# Patient Record
Sex: Male | Born: 1985 | Race: Black or African American | Hispanic: No | Marital: Single | Smoking: Current every day smoker
Health system: Southern US, Community
[De-identification: ages and names within clinical notes are randomized; demographics above are authoritative.]

## PROBLEM LIST (undated history)

## (undated) DIAGNOSIS — R51 Headache: Secondary | ICD-10-CM

## (undated) DIAGNOSIS — A630 Anogenital (venereal) warts: Secondary | ICD-10-CM

## (undated) DIAGNOSIS — J45909 Unspecified asthma, uncomplicated: Secondary | ICD-10-CM

## (undated) HISTORY — PX: NO PAST SURGERIES: SHX2092

## (undated) HISTORY — DX: Anogenital (venereal) warts: A63.0

---
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
## 2005-04-29 ENCOUNTER — Emergency Department (HOSPITAL_COMMUNITY): Admission: EM | Admit: 2005-04-29 | Discharge: 2005-04-29 | Payer: Self-pay | Admitting: Emergency Medicine

## 2009-08-08 ENCOUNTER — Emergency Department (HOSPITAL_COMMUNITY): Admission: EM | Admit: 2009-08-08 | Discharge: 2009-08-08 | Payer: Self-pay | Admitting: Emergency Medicine

## 2010-01-24 ENCOUNTER — Emergency Department (HOSPITAL_COMMUNITY): Admission: EM | Admit: 2010-01-24 | Discharge: 2010-01-24 | Payer: Self-pay | Admitting: Emergency Medicine

## 2010-06-25 ENCOUNTER — Emergency Department (HOSPITAL_COMMUNITY): Admission: EM | Admit: 2010-06-25 | Discharge: 2010-06-25 | Payer: Self-pay | Admitting: Emergency Medicine

## 2010-08-03 ENCOUNTER — Emergency Department (HOSPITAL_COMMUNITY): Admission: EM | Admit: 2010-08-03 | Discharge: 2010-07-01 | Payer: Self-pay | Admitting: Emergency Medicine

## 2010-08-11 ENCOUNTER — Emergency Department (HOSPITAL_COMMUNITY)
Admission: EM | Admit: 2010-08-11 | Discharge: 2010-08-11 | Payer: Self-pay | Source: Home / Self Care | Admitting: Emergency Medicine

## 2012-01-31 ENCOUNTER — Emergency Department (HOSPITAL_COMMUNITY): Payer: Self-pay

## 2012-01-31 ENCOUNTER — Emergency Department (HOSPITAL_COMMUNITY)
Admission: EM | Admit: 2012-01-31 | Discharge: 2012-01-31 | Disposition: A | Discharge status: Home / Self Care | Payer: No Typology Code available for payment source | Attending: Emergency Medicine | Admitting: Emergency Medicine

## 2012-01-31 ENCOUNTER — Encounter (HOSPITAL_COMMUNITY): Payer: Self-pay | Admitting: Emergency Medicine

## 2012-01-31 DIAGNOSIS — M549 Dorsalgia, unspecified: Secondary | ICD-10-CM | POA: Insufficient documentation

## 2012-01-31 DIAGNOSIS — M542 Cervicalgia: Secondary | ICD-10-CM | POA: Insufficient documentation

## 2012-01-31 DIAGNOSIS — T1490XA Injury, unspecified, initial encounter: Secondary | ICD-10-CM | POA: Insufficient documentation

## 2012-01-31 DIAGNOSIS — R079 Chest pain, unspecified: Secondary | ICD-10-CM | POA: Insufficient documentation

## 2012-01-31 DIAGNOSIS — R51 Headache: Secondary | ICD-10-CM | POA: Insufficient documentation

## 2012-01-31 DIAGNOSIS — M25519 Pain in unspecified shoulder: Secondary | ICD-10-CM | POA: Insufficient documentation

## 2012-01-31 DIAGNOSIS — S39012A Strain of muscle, fascia and tendon of lower back, initial encounter: Secondary | ICD-10-CM

## 2012-01-31 DIAGNOSIS — Y92410 Unspecified street and highway as the place of occurrence of the external cause: Secondary | ICD-10-CM | POA: Insufficient documentation

## 2012-01-31 MED ORDER — CYCLOBENZAPRINE HCL 10 MG PO TABS: 10.0000 mg | ORAL_TABLET | Freq: Two times a day (BID) | ORAL | Status: AC | PRN

## 2012-01-31 MED ORDER — IBUPROFEN 800 MG PO TABS
800.0000 mg | ORAL_TABLET | Freq: Once | ORAL | Status: AC
  Administered 2012-01-31: 800 mg via ORAL
  Filled 2012-01-31: qty 1

## 2012-01-31 MED ORDER — IBUPROFEN 800 MG PO TABS: 800.0000 mg | ORAL_TABLET | Freq: Three times a day (TID) | ORAL | Status: DC

## 2012-01-31 MED ORDER — ALBUTEROL SULFATE HFA 108 (90 BASE) MCG/ACT IN AERS: 1.0000 | INHALATION_SPRAY | Freq: Four times a day (QID) | RESPIRATORY_TRACT | Status: DC | PRN
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
## 2012-01-31 NOTE — ED Notes (Signed)
Pt denies any questions upon discharge. 
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
## 2012-02-08 ENCOUNTER — Encounter (HOSPITAL_COMMUNITY): Payer: Self-pay

## 2012-02-08 ENCOUNTER — Emergency Department (HOSPITAL_COMMUNITY)
Admission: EM | Admit: 2012-02-08 | Discharge: 2012-02-08 | Disposition: A | Discharge status: Home Health | Payer: Self-pay | Attending: Emergency Medicine | Admitting: Emergency Medicine

## 2012-02-08 DIAGNOSIS — S335XXA Sprain of ligaments of lumbar spine, initial encounter: Secondary | ICD-10-CM | POA: Insufficient documentation

## 2012-02-08 DIAGNOSIS — J45901 Unspecified asthma with (acute) exacerbation: Secondary | ICD-10-CM | POA: Insufficient documentation

## 2012-02-08 DIAGNOSIS — T148XXA Other injury of unspecified body region, initial encounter: Secondary | ICD-10-CM

## 2012-02-08 DIAGNOSIS — X58XXXA Exposure to other specified factors, initial encounter: Secondary | ICD-10-CM | POA: Insufficient documentation

## 2012-02-08 HISTORY — DX: Unspecified asthma, uncomplicated: J45.909

## 2012-02-08 MED ORDER — KETOROLAC TROMETHAMINE 60 MG/2ML IM SOLN
60.0000 mg | Freq: Once | INTRAMUSCULAR | Status: AC
  Administered 2012-02-08: 60 mg via INTRAMUSCULAR
  Filled 2012-02-08: qty 2

## 2012-02-08 MED ORDER — METHYLPREDNISOLONE SODIUM SUCC 125 MG IJ SOLR
125.0000 mg | Freq: Once | INTRAMUSCULAR | Status: AC
  Administered 2012-02-08: 125 mg via INTRAVENOUS
  Filled 2012-02-08: qty 2

## 2012-02-08 MED ORDER — ALBUTEROL (5 MG/ML) CONTINUOUS INHALATION SOLN
10.0000 mg/h | INHALATION_SOLUTION | RESPIRATORY_TRACT | Status: AC
  Administered 2012-02-08: 10 mg/h via RESPIRATORY_TRACT

## 2012-02-08 MED ORDER — ACETAMINOPHEN 325 MG PO TABS
650.0000 mg | ORAL_TABLET | Freq: Once | ORAL | Status: AC
  Administered 2012-02-08: 650 mg via ORAL

## 2012-02-08 MED ORDER — ALBUTEROL SULFATE HFA 108 (90 BASE) MCG/ACT IN AERS
2.0000 | INHALATION_SPRAY | Freq: Four times a day (QID) | RESPIRATORY_TRACT | Status: DC
  Administered 2012-02-08: 2 via RESPIRATORY_TRACT
  Filled 2012-02-08: qty 6.7

## 2012-02-08 MED ORDER — ACETAMINOPHEN 325 MG PO TABS
ORAL_TABLET | ORAL | Status: AC
  Filled 2012-02-08: qty 2

## 2012-02-08 MED ORDER — ALBUTEROL SULFATE (5 MG/ML) 0.5% IN NEBU: 2.5000 mg | INHALATION_SOLUTION | Freq: Once | RESPIRATORY_TRACT | Status: DC

## 2012-02-08 MED ORDER — PREDNISONE 10 MG PO TABS: 50.0000 mg | ORAL_TABLET | Freq: Every day | ORAL | Status: DC
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
## 2012-02-09 NOTE — ED Provider Notes (Signed)
Medical screening examination/treatment/procedure(s) were performed by non-physician practitioner and as supervising physician I was immediately available for consultation/collaboration.   Dayton Bailiff, MD 02/09/12 1622

## 2012-02-25 DIAGNOSIS — A630 Anogenital (venereal) warts: Secondary | ICD-10-CM

## 2012-02-25 HISTORY — DX: Anogenital (venereal) warts: A63.0

## 2012-03-03 ENCOUNTER — Encounter (INDEPENDENT_AMBULATORY_CARE_PROVIDER_SITE_OTHER): Payer: Self-pay | Admitting: Surgery

## 2012-03-03 ENCOUNTER — Ambulatory Visit (INDEPENDENT_AMBULATORY_CARE_PROVIDER_SITE_OTHER): Payer: Self-pay | Admitting: Surgery

## 2012-03-03 ENCOUNTER — Telehealth (INDEPENDENT_AMBULATORY_CARE_PROVIDER_SITE_OTHER): Payer: Self-pay

## 2012-03-03 VITALS — BP 122/80 | HR 68 | Temp 96.9°F | Resp 12 | Ht 73.0 in | Wt 168.8 lb

## 2012-03-03 DIAGNOSIS — A630 Anogenital (venereal) warts: Secondary | ICD-10-CM | POA: Insufficient documentation

## 2012-03-03 NOTE — Progress Notes (Signed)
General Surgery [facility]  Chief Complaint  Patient presents with  . Rectal Pain    new pt- eval anal warts    HISTORY: The patient is a 26 year old black male referred by Dr. Burnice Logan at [facility].  Patient has a 6 month history of anal condylomata. He has received no prior treatment.  Past Medical History  Diagnosis Date  . Asthma   . Anal warts      Current Outpatient Prescriptions  Medication Sig Dispense Refill  . albuterol (PROVENTIL HFA;VENTOLIN HFA) 108 (90 BASE) MCG/ACT inhaler Inhale 1-2 puffs into the lungs every 6 (six) hours as needed for wheezing.  1 Inhaler  0  . predniSONE (DELTASONE) 10 MG tablet Take 5 tablets (50 mg total) by mouth daily.  15 tablet  0     No Known Allergies   History reviewed. No pertinent family history.   History   Social History  . Marital Status: Single    Spouse Name: N/A    Number of Children: N/A  . Years of Education: N/A   Social History Main Topics  . Smoking status: Current Everyday Smoker    Types: Cigars  . Smokeless tobacco: None   Comment: smokes black and milds  . Alcohol Use: Yes     occasionally  . Drug Use: No  . Sexually Active:    Other Topics Concern  . None   Social History Narrative  . None     REVIEW OF SYSTEMS - PERTINENT POSITIVES ONLY: Denies bleeding. Mild pain. No prior lesions. No prior surgery.  EXAM: Filed Vitals:   03/03/12 0941  BP: 122/80  Pulse: 68  Temp: 96.9 F (36.1 C)  Resp: 12    HEENT: normocephalic; pupils equal and reactive; sclerae clear; dentition good; mucous membranes moist NECK:  symmetric on extension; no palpable anterior or posterior cervical lymphadenopathy; no supraclavicular masses; no tenderness CHEST: clear to auscultation bilaterally without rales, rhonchi, or wheezes CARDIAC: regular rate and rhythm without significant murmur; peripheral pulses are full GU:  External exam shows relatively  normal anoderm. Eversion however shows anal condylomata within the anal canal. Digital rectal exam was performed with mild discomfort. Anoscopy is performed. 360 view of the lower rectal mucosa shows normal mucosa. Grade 1-2 internal hemorrhoids without friability or bleeding. There are approximately 6 lesions located mostly in the anterior anal canal extending from the dentate line to the anal verge. These all measure between 2 and 6 mm in size. EXT:  non-tender without edema; no deformity NEURO: no gross focal deficits; no sign of tremor   LABORATORY RESULTS: See [facility] (CHL-Epic) for most recent results   RADIOLOGY RESULTS: See [facility] (CHL-Epic) for most recent results   IMPRESSION: Anal condylomata  PLAN: I discussed with the patient the indications for surgical fulguration of his anal condylomata. This will be performed as an outpatient procedure. General anesthesia will be required. Patient understands and wishes to proceed.  The risks and benefits of the procedure have been discussed at length with the patient.  The patient understands the proposed procedure, potential alternative treatments, and the course of recovery to be expected.  All of the patient's questions have been answered at this time.  The patient wishes to proceed with surgery.  Velora Heckler, MD, FACS General & Endocrine Surgery [facility]   Visit Diagnoses: 1. Perianal condylomata     Primary Care Physician: No primary provider on file.

## 2012-03-03 NOTE — Patient Instructions (Signed)
ANORECTAL PROCEDURES: 1.  Tub soaks 2-3 times daily in warm water (may add Epsom salts if desired) 2.  Stool softener for one month (store brand Miralax or Colace) 3.  Avoid toilet paper - use baby wipes or Tucks pads 4.  Increase water intake - 6-8 glasses daily 5.  Apply dry pad to area until drainage stops 

## 2012-03-03 NOTE — Telephone Encounter (Signed)
LMOM for po appt date.

## 2012-03-04 ENCOUNTER — Encounter (HOSPITAL_BASED_OUTPATIENT_CLINIC_OR_DEPARTMENT_OTHER): Payer: Self-pay | Admitting: *Deleted

## 2012-03-06 ENCOUNTER — Encounter (HOSPITAL_BASED_OUTPATIENT_CLINIC_OR_DEPARTMENT_OTHER)
Admission: RE | Disposition: A | Discharge status: Home / Self Care | Payer: Self-pay | Source: Ambulatory Visit | Attending: Surgery

## 2012-03-06 ENCOUNTER — Encounter (HOSPITAL_BASED_OUTPATIENT_CLINIC_OR_DEPARTMENT_OTHER): Payer: Self-pay | Admitting: Anesthesiology

## 2012-03-06 ENCOUNTER — Ambulatory Visit (HOSPITAL_BASED_OUTPATIENT_CLINIC_OR_DEPARTMENT_OTHER)
Admission: RE | Admit: 2012-03-06 | Discharge: 2012-03-06 | Disposition: A | Discharge status: Home / Self Care | Payer: Self-pay | Source: Ambulatory Visit | Attending: Surgery | Admitting: Surgery

## 2012-03-06 ENCOUNTER — Encounter (HOSPITAL_BASED_OUTPATIENT_CLINIC_OR_DEPARTMENT_OTHER): Payer: Self-pay | Admitting: *Deleted

## 2012-03-06 HISTORY — DX: Headache: R51

## 2012-03-06 SURGERY — FULGURATION, WART, ANUS
Anesthesia: General

## 2012-03-06 MED ORDER — FLEET ENEMA 7-19 GM/118ML RE ENEM: 1.0000 | ENEMA | Freq: Once | RECTAL | Status: DC

## 2012-03-06 MED ORDER — LACTATED RINGERS IV SOLN: INTRAVENOUS | Status: DC

## 2012-03-06 MED ORDER — SODIUM CHLORIDE 0.9 % IV SOLN: 1.0000 g | INTRAVENOUS | Status: DC

## 2012-03-06 SURGICAL SUPPLY — 27 items
BLADE SURG 15 STRL LF DISP TIS (BLADE) ×1 IMPLANT
BLADE SURG 15 STRL SS (BLADE)
CLOTH BEACON ORANGE TIMEOUT ST (SAFETY) ×1 IMPLANT
DECANTER SPIKE VIAL GLASS SM (MISCELLANEOUS) ×1 IMPLANT
DRAPE UTILITY XL STRL (DRAPES) ×1 IMPLANT
ELECT REM PT RETURN 9FT ADLT (ELECTROSURGICAL)
ELECTRODE REM PT RTRN 9FT ADLT (ELECTROSURGICAL) ×1 IMPLANT
GAUZE SPONGE 4X4 12PLY STRL LF (GAUZE/BANDAGES/DRESSINGS) ×1 IMPLANT
GLOVE SURG ORTHO 8.0 STRL STRW (GLOVE) ×1 IMPLANT
GOWN PREVENTION PLUS XLARGE (GOWN DISPOSABLE) ×1 IMPLANT
GOWN PREVENTION PLUS XXLARGE (GOWN DISPOSABLE) ×1 IMPLANT
NDL HYPO 25X1 1.5 SAFETY (NEEDLE) ×1 IMPLANT
NEEDLE HYPO 25X1 1.5 SAFETY (NEEDLE) IMPLANT
PACK BASIN DAY SURGERY FS (CUSTOM PROCEDURE TRAY) ×1 IMPLANT
PACK LITHOTOMY IV (CUSTOM PROCEDURE TRAY) IMPLANT
PENCIL BUTTON HOLSTER BLD 10FT (ELECTRODE) ×1 IMPLANT
SPONGE SURGIFOAM ABS GEL 12-7 (HEMOSTASIS) IMPLANT
SURGILUBE 2OZ TUBE FLIPTOP (MISCELLANEOUS) ×4 IMPLANT
SUT CHROMIC 3 0 SH 27 (SUTURE) ×1 IMPLANT
SYR CONTROL 10ML LL (SYRINGE) ×1 IMPLANT
TOWEL OR 17X24 6PK STRL BLUE (TOWEL DISPOSABLE) ×2 IMPLANT
TOWEL OR NON WOVEN STRL DISP B (DISPOSABLE) ×1 IMPLANT
TRAY DSU PREP LF (CUSTOM PROCEDURE TRAY) ×1 IMPLANT
TRAY PROCTOSCOPIC FIBER OPTIC (SET/KITS/TRAYS/PACK) IMPLANT
TUBING STERILE (MISCELLANEOUS) ×1 IMPLANT
UNDERPAD 30X30 INCONTINENT (UNDERPADS AND DIAPERS) ×1 IMPLANT
WATER STERILE IRR 1000ML POUR (IV SOLUTION) ×1 IMPLANT

## 2012-03-06 NOTE — Progress Notes (Signed)
Patient's procedure cancelled per Dr. Gelene Mink as he admitted to eating chips at 11a.  Patient notified of cancellation by Dr. Gelene Mink.

## 2012-03-06 NOTE — Progress Notes (Signed)
Dr. Gelene Mink notified that patient admitted to eating a small amount of chips at 11a 03/06/12

## 2012-03-07 ENCOUNTER — Encounter (HOSPITAL_BASED_OUTPATIENT_CLINIC_OR_DEPARTMENT_OTHER): Payer: Self-pay | Admitting: Surgery

## 2012-04-08 ENCOUNTER — Encounter (INDEPENDENT_AMBULATORY_CARE_PROVIDER_SITE_OTHER): Payer: Self-pay | Admitting: Surgery

## 2012-10-27 ENCOUNTER — Ambulatory Visit (INDEPENDENT_AMBULATORY_CARE_PROVIDER_SITE_OTHER): Payer: Self-pay | Admitting: Surgery

## 2012-10-27 ENCOUNTER — Encounter (INDEPENDENT_AMBULATORY_CARE_PROVIDER_SITE_OTHER): Payer: Self-pay | Admitting: Surgery

## 2012-10-27 VITALS — BP 120/80 | HR 67 | Temp 96.4°F | Resp 18 | Ht 75.0 in | Wt 185.2 lb

## 2012-10-27 DIAGNOSIS — A630 Anogenital (venereal) warts: Secondary | ICD-10-CM

## 2012-10-27 NOTE — Progress Notes (Signed)
General Surgery - [facility]  Visit Diagnoses: 1. Perianal condylomata     HISTORY: Patient is a 27-year-old black male with anal condylomata. Patient was seen last summer and scheduled for fulguration. Unfortunately he on the morning of surgery and anesthesia appropriately canceled his case. Patient now returns for evaluation and to schedule his surgical procedure.  PERTINENT REVIEW OF SYSTEMS: Patient notes slow enlargement of lesions.  He experiences minor pain. He denies bleeding. He has noted a similar lesion on the penis.  EXAM: HEENT: normocephalic; pupils equal and reactive; sclerae clear; dentition good; mucous membranes moist NECK:  symmetric on extension; no palpable anterior or posterior cervical lymphadenopathy; no supraclavicular masses; no tenderness CHEST: clear to auscultation bilaterally without rales, rhonchi, or wheezes CARDIAC: regular rate and rhythm without significant murmur; peripheral pulses are full GU:  Flat whitish plaque on the anterior glans of penis; external anal exam essentially normal; he version of the anoderm however shows at least 5 lesions consistent with condylomata within the anal canal; the largest lesion measures approximately 6 mm in diameter EXT:  non-tender without edema; no deformity NEURO: no gross focal deficits; no sign of tremor   IMPRESSION: #1 anal condylomata, multiple #2 possible lesion on glans of penis  PLAN: Patient and I discussed surgical management. I have recommended excision and surgical fulguration. I will not treat the lesion on the penis. That would require evaluation by urology.  We will schedule the patient to undergo examination under anesthesia and excision and fulguration of anal condylomata. This will be scheduled as an outpatient surgical procedure under general anesthesia.  The risks and benefits of the procedure have been discussed at length with the patient.  The patient understands the  proposed procedure, potential alternative treatments, and the course of recovery to be expected.  All of the patient's questions have been answered at this time.  The patient wishes to proceed with surgery.  Todd M. Gerkin, MD, FACS [facility] Office: [phone]    

## 2012-10-27 NOTE — Patient Instructions (Signed)
Genital Warts Genital warts are a sexually transmitted infection. They may appear as small bumps on the tissues of the genital area. CAUSES  Genital warts are caused by a virus called human papillomavirus (HPV). HPV is the most common sexually transmitted disease (STD) and infection of the sex organs. This infection is spread by having unprotected sex with an infected person. It can be spread by vaginal, anal, and oral sex. Many people do not know they are infected. They may be infected for years without problems. However, even if they do not have problems, they can unknowingly pass the infection to their sexual partners. SYMPTOMS   Itching and irritation in the genital area.  Warts that bleed.  Painful sexual intercourse. DIAGNOSIS  Warts are usually recognized with the naked eye on the vagina, vulva, perineum, anus, and rectum. Certain tests can also diagnose genital warts, such as:  A Pap test.  A tissue sample (biopsy) exam.  Colposcopy. A magnifying tool is used to examine the vagina and cervix. The HPV cells will change color when certain solutions are used. TREATMENT  Warts can be removed by:  Applying certain chemicals, such as cantharidin or podophyllin.  Liquid nitrogen freezing (cryotherapy).  Immunotherapy with candida or trichophyton injections.  Laser treatment.  Burning with an electrified probe (electrocautery).  Interferon injections.  Surgery. PREVENTION  HPV vaccination can help prevent HPV infections that cause genital warts and that cause cancer of the cervix. It is recommended that the vaccination be given to people between the ages 9 to 26 years old. The vaccine might not work as well or might not work at all if you already have HPV. It should not be given to pregnant women. HOME CARE INSTRUCTIONS   It is important to follow your caregiver's instructions. The warts will not go away without treatment. Repeat treatments are often needed to get rid of warts.  Even after it appears that the warts are gone, the normal tissue underneath often remains infected.  Do not try to treat genital warts with medicine used to treat hand warts. This type of medicine is strong and can burn the skin in the genital area, causing more damage.  Tell your past and current sexual partner(s) that you have genital warts. They may be infected also and need treatment.  Avoid sexual contact while being treated.  Do not touch or scratch the warts. The infection may spread to other parts of your body.  Women with genital warts should have a cervical cancer check (Pap test) at least once a year. This type of cancer is slow-growing and can be cured if found early. Chances of developing cervical cancer are increased with HPV.  Inform your obstetrician about your warts in the event of pregnancy. This virus can be passed to the baby's respiratory tract. Discuss this with your caregiver.  Use a condom during sexual intercourse. Following treatment, the use of condoms will help prevent reinfection.  Ask your caregiver about using over-the-counter anti-itch creams. SEEK MEDICAL CARE IF:   Your treated skin becomes red, swollen, or painful.  You have a fever.  You feel generally ill.  You feel little lumps in and around your genital area.  You are bleeding or have painful sexual intercourse. MAKE SURE YOU:   Understand these instructions.  Will watch your condition.  Will get help right away if you are not doing well or get worse. Document Released: 08/10/2000 Document Revised: 11/05/2011 Document Reviewed: 02/19/2011 ExitCare Patient Information 2013 [employer].  

## 2012-10-29 ENCOUNTER — Encounter (HOSPITAL_BASED_OUTPATIENT_CLINIC_OR_DEPARTMENT_OTHER): Payer: Self-pay | Admitting: *Deleted

## 2012-10-30 ENCOUNTER — Encounter (HOSPITAL_BASED_OUTPATIENT_CLINIC_OR_DEPARTMENT_OTHER): Payer: Self-pay | Admitting: *Deleted

## 2012-10-30 ENCOUNTER — Encounter (HOSPITAL_BASED_OUTPATIENT_CLINIC_OR_DEPARTMENT_OTHER): Payer: Self-pay | Admitting: Certified Registered Nurse Anesthetist

## 2012-10-30 ENCOUNTER — Ambulatory Visit (HOSPITAL_BASED_OUTPATIENT_CLINIC_OR_DEPARTMENT_OTHER): Payer: Self-pay | Admitting: Certified Registered Nurse Anesthetist

## 2012-10-30 ENCOUNTER — Encounter (HOSPITAL_BASED_OUTPATIENT_CLINIC_OR_DEPARTMENT_OTHER)
Admission: RE | Disposition: A | Discharge status: Home / Self Care | Payer: Self-pay | Source: Ambulatory Visit | Attending: Surgery

## 2012-10-30 ENCOUNTER — Ambulatory Visit (HOSPITAL_BASED_OUTPATIENT_CLINIC_OR_DEPARTMENT_OTHER)
Admission: RE | Admit: 2012-10-30 | Discharge: 2012-10-30 | Disposition: A | Discharge status: Home / Self Care | Payer: Self-pay | Source: Ambulatory Visit | Attending: Surgery | Admitting: Surgery

## 2012-10-30 DIAGNOSIS — A630 Anogenital (venereal) warts: Secondary | ICD-10-CM | POA: Insufficient documentation

## 2012-10-30 DIAGNOSIS — N4889 Other specified disorders of penis: Secondary | ICD-10-CM | POA: Insufficient documentation

## 2012-10-30 DIAGNOSIS — J45909 Unspecified asthma, uncomplicated: Secondary | ICD-10-CM | POA: Insufficient documentation

## 2012-10-30 HISTORY — PX: WART FULGURATION: SHX5245

## 2012-10-30 SURGERY — FULGURATION, WART, ANUS
Anesthesia: General | Site: Rectum | Wound class: Clean Contaminated

## 2012-10-30 MED ORDER — FENTANYL CITRATE 0.05 MG/ML IJ SOLN: 50.0000 ug | INTRAMUSCULAR | Status: DC | PRN

## 2012-10-30 MED ORDER — ONDANSETRON HCL 4 MG/2ML IJ SOLN
INTRAMUSCULAR | Status: DC | PRN
  Administered 2012-10-30: 4 mg via INTRAVENOUS

## 2012-10-30 MED ORDER — CEFAZOLIN SODIUM-DEXTROSE 2-3 GM-% IV SOLR
2.0000 g | INTRAVENOUS | Status: AC
  Administered 2012-10-30: 2 g via INTRAVENOUS

## 2012-10-30 MED ORDER — PROMETHAZINE HCL 25 MG/ML IJ SOLN: 6.2500 mg | INTRAMUSCULAR | Status: DC | PRN

## 2012-10-30 MED ORDER — LACTATED RINGERS IV SOLN
INTRAVENOUS | Status: DC
  Administered 2012-10-30: 11:00:00 via INTRAVENOUS

## 2012-10-30 MED ORDER — FENTANYL CITRATE 0.05 MG/ML IJ SOLN
INTRAMUSCULAR | Status: DC | PRN
  Administered 2012-10-30 (×2): 50 ug via INTRAVENOUS

## 2012-10-30 MED ORDER — LIDOCAINE HCL (CARDIAC) 20 MG/ML IV SOLN
INTRAVENOUS | Status: DC | PRN
  Administered 2012-10-30: 60 mg via INTRAVENOUS

## 2012-10-30 MED ORDER — PROPOFOL 10 MG/ML IV BOLUS
INTRAVENOUS | Status: DC | PRN
  Administered 2012-10-30: 200 mg via INTRAVENOUS

## 2012-10-30 MED ORDER — OXYCODONE HCL 5 MG PO TABS: 5.0000 mg | ORAL_TABLET | Freq: Once | ORAL | Status: DC | PRN

## 2012-10-30 MED ORDER — OXYCODONE HCL 5 MG/5ML PO SOLN: 5.0000 mg | Freq: Once | ORAL | Status: DC | PRN

## 2012-10-30 MED ORDER — HYDROCODONE-ACETAMINOPHEN 5-325 MG PO TABS: 1.0000 | ORAL_TABLET | ORAL | Status: DC | PRN

## 2012-10-30 MED ORDER — MIDAZOLAM HCL 5 MG/5ML IJ SOLN
INTRAMUSCULAR | Status: DC | PRN
  Administered 2012-10-30: 1 mg via INTRAVENOUS

## 2012-10-30 MED ORDER — MIDAZOLAM HCL 2 MG/2ML IJ SOLN: 1.0000 mg | INTRAMUSCULAR | Status: DC | PRN

## 2012-10-30 MED ORDER — MEPERIDINE HCL 25 MG/ML IJ SOLN: 6.2500 mg | INTRAMUSCULAR | Status: DC | PRN

## 2012-10-30 MED ORDER — BUPIVACAINE HCL (PF) 0.25 % IJ SOLN
INTRAMUSCULAR | Status: DC | PRN
  Administered 2012-10-30: 10 mL

## 2012-10-30 MED ORDER — LIDOCAINE HCL (CARDIAC) 20 MG/ML IV SOLN: INTRAVENOUS | Status: DC | PRN

## 2012-10-30 MED ORDER — HYDROMORPHONE HCL PF 1 MG/ML IJ SOLN: 0.2500 mg | INTRAMUSCULAR | Status: DC | PRN

## 2012-10-30 MED ORDER — DEXAMETHASONE SODIUM PHOSPHATE 4 MG/ML IJ SOLN
INTRAMUSCULAR | Status: DC | PRN
  Administered 2012-10-30: 10 mg via INTRAVENOUS

## 2012-10-30 MED ORDER — BACITRACIN ZINC 500 UNIT/GM EX OINT
TOPICAL_OINTMENT | CUTANEOUS | Status: DC | PRN
  Administered 2012-10-30: 1 via TOPICAL

## 2012-10-30 SURGICAL SUPPLY — 34 items
BLADE SURG 15 STRL LF DISP TIS (BLADE) ×1 IMPLANT
BLADE SURG 15 STRL SS (BLADE) ×2
BRIEF STRETCH FOR OB PAD LRG (UNDERPADS AND DIAPERS) ×1 IMPLANT
CLOTH BEACON ORANGE TIMEOUT ST (SAFETY) ×2 IMPLANT
DECANTER SPIKE VIAL GLASS SM (MISCELLANEOUS) ×2 IMPLANT
DRAPE UTILITY XL STRL (DRAPES) ×2 IMPLANT
DRSG PAD ABDOMINAL 8X10 ST (GAUZE/BANDAGES/DRESSINGS) ×1 IMPLANT
ELECT REM PT RETURN 9FT ADLT (ELECTROSURGICAL) ×2
ELECTRODE REM PT RTRN 9FT ADLT (ELECTROSURGICAL) ×1 IMPLANT
FILTER 7/8 IN (FILTER) ×1 IMPLANT
GAUZE SPONGE 4X4 12PLY STRL LF (GAUZE/BANDAGES/DRESSINGS) ×2 IMPLANT
GLOVE BIO SURGEON STRL SZ7 (GLOVE) ×1 IMPLANT
GLOVE BIOGEL M STRL SZ7.5 (GLOVE) ×1 IMPLANT
GLOVE BIOGEL PI IND STRL 8 (GLOVE) IMPLANT
GLOVE BIOGEL PI INDICATOR 8 (GLOVE) ×1
GLOVE SURG ORTHO 8.0 STRL STRW (GLOVE) ×2 IMPLANT
GOWN PREVENTION PLUS XLARGE (GOWN DISPOSABLE) ×1 IMPLANT
GOWN PREVENTION PLUS XXLARGE (GOWN DISPOSABLE) ×4 IMPLANT
NDL HYPO 25X1 1.5 SAFETY (NEEDLE) ×1 IMPLANT
NEEDLE HYPO 25X1 1.5 SAFETY (NEEDLE) ×2 IMPLANT
PACK BASIN DAY SURGERY FS (CUSTOM PROCEDURE TRAY) ×2 IMPLANT
PACK LITHOTOMY IV (CUSTOM PROCEDURE TRAY) ×1 IMPLANT
PENCIL BUTTON HOLSTER BLD 10FT (ELECTRODE) ×2 IMPLANT
SPONGE SURGIFOAM ABS GEL 12-7 (HEMOSTASIS) IMPLANT
SURGILUBE 2OZ TUBE FLIPTOP (MISCELLANEOUS) ×5 IMPLANT
SUT CHROMIC 3 0 SH 27 (SUTURE) ×2 IMPLANT
SYR CONTROL 10ML LL (SYRINGE) ×2 IMPLANT
TOWEL OR 17X24 6PK STRL BLUE (TOWEL DISPOSABLE) ×4 IMPLANT
TOWEL OR NON WOVEN STRL DISP B (DISPOSABLE) ×2 IMPLANT
TRAY DSU PREP LF (CUSTOM PROCEDURE TRAY) ×1 IMPLANT
TRAY PROCTOSCOPIC FIBER OPTIC (SET/KITS/TRAYS/PACK) IMPLANT
TUBING STERILE (MISCELLANEOUS) ×2 IMPLANT
UNDERPAD 30X30 INCONTINENT (UNDERPADS AND DIAPERS) ×2 IMPLANT
WATER STERILE IRR 1000ML POUR (IV SOLUTION) ×1 IMPLANT

## 2012-10-30 NOTE — Interval H&P Note (Signed)
History and Physical Interval Note:  10/30/2012 12:16 PM  Clarence Simpson  has presented today for surgery, with the diagnosis of anal condylomata  The various methods of treatment have been discussed with the patient and family. After consideration of risks, benefits and other options for treatment, the patient has consented to    Procedure(s): FULGURATION Anal condylomata, rectal exam under anesthesia (N/A) as a surgical intervention .    The patient's history has been reviewed, patient examined, no change in status, stable for surgery.  I have reviewed the patient's chart and labs.  Questions were answered to the patient's satisfaction.    Velora Heckler, MD, [facility] Office: [phone]   GERKIN,TODD Judie Petit

## 2012-10-30 NOTE — Anesthesia Procedure Notes (Signed)
Procedure Name: LMA Insertion Date/Time: 10/30/2012 12:34 PM Performed by: Meyer Russel Pre-anesthesia Checklist: Patient identified, Emergency Drugs available, Suction available and Patient being monitored Patient Re-evaluated:Patient Re-evaluated prior to inductionOxygen Delivery Method: Circle System Utilized Preoxygenation: Pre-oxygenation with 100% oxygen Intubation Type: IV induction Ventilation: Mask ventilation without difficulty LMA: LMA inserted LMA Size: 5.0 Number of attempts: 1 Airway Equipment and Method: bite block Placement Confirmation: positive ETCO2 and breath sounds checked- equal and bilateral Tube secured with: Tape Dental Injury: Teeth and Oropharynx as per pre-operative assessment

## 2012-10-30 NOTE — Anesthesia Preprocedure Evaluation (Addendum)
Anesthesia Evaluation  Patient identified by MRN, date of birth, ID band Patient awake    Reviewed: Allergy & Precautions, H&P , NPO status , Patient's Chart, lab work & pertinent test results  History of Anesthesia Complications Negative for: history of anesthetic complications  Airway Mallampati: I  Neck ROM: Full    Dental   Pulmonary asthma ,  breath sounds clear to auscultation        Cardiovascular negative cardio ROS  Rhythm:Regular Rate:Normal     Neuro/Psych negative neurological ROS     GI/Hepatic negative GI ROS, Neg liver ROS,   Endo/Other  negative endocrine ROS  Renal/GU negative Renal ROS     Musculoskeletal negative musculoskeletal ROS (+)   Abdominal   Peds  Hematology negative hematology ROS (+)   Anesthesia Other Findings   Reproductive/Obstetrics                           Anesthesia Physical Anesthesia Plan  ASA: II  Anesthesia Plan: General   Post-op Pain Management:    Induction: Intravenous  Airway Management Planned: LMA  Additional Equipment:   Intra-op Plan:   Post-operative Plan: Extubation in OR  Informed Consent: I have reviewed the patients History and Physical, chart, labs and discussed the procedure including the risks, benefits and alternatives for the proposed anesthesia with the patient or authorized representative who has indicated his/her understanding and acceptance.   Dental advisory given  Plan Discussed with: CRNA and Surgeon  Anesthesia Plan Comments:         Anesthesia Quick Evaluation

## 2012-10-30 NOTE — Brief Op Note (Signed)
10/30/2012  12:54 PM  PATIENT:  Clarence Simpson  27 y.o. male  PRE-OPERATIVE DIAGNOSIS:  Anal condylomata  POST-OPERATIVE DIAGNOSIS:  Anal condylomata  PROCEDURE:  Procedure(s): Fulgeration  Anal condylomata, Rectal exam under anesthesia (N/A)  SURGEON:  Surgeon(s) and Role:    * Velora Heckler, MD - Primary  ANESTHESIA:   general  EBL:  Total I/O In: 750 [I.V.:750] Out: -   BLOOD ADMINISTERED:none  DRAINS: none   LOCAL MEDICATIONS USED:  MARCAINE     SPECIMEN:  Excision  DISPOSITION OF SPECIMEN:  PATHOLOGY  COUNTS:  YES  TOURNIQUET:  * No tourniquets in log *  DICTATION: .Other Dictation: Dictation Number [id number]  PLAN OF CARE: Discharge to home after PACU  PATIENT DISPOSITION:  PACU - hemodynamically stable.   Delay start of Pharmacological VTE agent (>24hrs) due to surgical blood loss or risk of bleeding: yes  Velora Heckler, MD, [facility] Office: [phone]

## 2012-10-30 NOTE — Progress Notes (Signed)
Pt request confidentiality with family in surgery reasons.  Overviewed with Clarence Simpson, Consulting civil engineer and pt Spoke with [name]/ and d/c instructions reviewed with pt and sealed in envelope for pt.  With overview basic care with family member for 24 hr watch.  Pt verbalized understanding.

## 2012-10-30 NOTE — H&P (View-Only) (Signed)
General Surgery [facility]  Visit Diagnoses: 1. Perianal condylomata     HISTORY: Patient is a 27 year old black male with anal condylomata. Patient was seen last summer and scheduled for fulguration. Unfortunately he on the morning of surgery and anesthesia appropriately canceled his case. Patient now returns for evaluation and to schedule his surgical procedure.  PERTINENT REVIEW OF SYSTEMS: Patient notes slow enlargement of lesions.  He experiences minor pain. He denies bleeding. He has noted a similar lesion on the penis.  EXAM: HEENT: normocephalic; pupils equal and reactive; sclerae clear; dentition good; mucous membranes moist NECK:  symmetric on extension; no palpable anterior or posterior cervical lymphadenopathy; no supraclavicular masses; no tenderness CHEST: clear to auscultation bilaterally without rales, rhonchi, or wheezes CARDIAC: regular rate and rhythm without significant murmur; peripheral pulses are full GU:  Flat whitish plaque on the anterior glans of penis; external anal exam essentially normal; he version of the anoderm however shows at least 5 lesions consistent with condylomata within the anal canal; the largest lesion measures approximately 6 mm in diameter EXT:  non-tender without edema; no deformity NEURO: no gross focal deficits; no sign of tremor   IMPRESSION: #1 anal condylomata, multiple #2 possible lesion on glans of penis  PLAN: Patient and I discussed surgical management. I have recommended excision and surgical fulguration. I will not treat the lesion on the penis. That would require evaluation by urology.  We will schedule the patient to undergo examination under anesthesia and excision and fulguration of anal condylomata. This will be scheduled as an outpatient surgical procedure under general anesthesia.  The risks and benefits of the procedure have been discussed at length with the patient.  The patient understands the  proposed procedure, potential alternative treatments, and the course of recovery to be expected.  All of the patient's questions have been answered at this time.  The patient wishes to proceed with surgery.  Velora Heckler, MD, [facility] Office: [phone]

## 2012-10-30 NOTE — Transfer of Care (Signed)
Immediate Anesthesia Transfer of Care Note  Patient: Clarence Simpson  Procedure(s) Performed: Procedure(s): Fulgeration  Anal condylomata, Rectal exam under anesthesia (N/A)  Patient Location: PACU  Anesthesia Type:General  Level of Consciousness: awake, sedated and responds to stimulation  Airway & Oxygen Therapy: Patient Spontanous Breathing and Patient connected to face mask oxygen  Post-op Assessment: Report given to PACU RN, Post -op Vital signs reviewed and stable and Patient moving all extremities  Post vital signs: Reviewed and stable  Complications: No apparent anesthesia complications

## 2012-10-30 NOTE — Anesthesia Postprocedure Evaluation (Signed)
  Anesthesia Post-op Note  Patient: Clarence Simpson  Procedure(s) Performed: Procedure(s): Fulgeration  Anal condylomata, Rectal exam under anesthesia (N/A)  Patient Location: PACU  Anesthesia Type:General  Level of Consciousness: awake and alert   Airway and Oxygen Therapy: Patient Spontanous Breathing  Post-op Pain: none  Post-op Assessment: Post-op Vital signs reviewed, Patient's Cardiovascular Status Stable, Respiratory Function Stable, Patent Airway and No signs of Nausea or vomiting  Post-op Vital Signs: Reviewed and stable  Complications: No apparent anesthesia complications

## 2012-10-31 ENCOUNTER — Encounter (HOSPITAL_BASED_OUTPATIENT_CLINIC_OR_DEPARTMENT_OTHER): Payer: Self-pay | Admitting: Surgery

## 2012-10-31 NOTE — Op Note (Signed)
Clarence Simpson, Clarence Simpson            ACCOUNT NO.:  [id number]  MEDICAL RECORD NO.:  [id number]  LOCATION:                                 FACILITY:  PHYSICIAN:  Velora Heckler, MD      DATE OF BIRTH:  12/04/1965  DATE OF PROCEDURE:  10/30/2012                               OPERATIVE REPORT   PREOPERATIVE DIAGNOSIS:  Anal condylomata.  POSTOPERATIVE DIAGNOSIS:  Anal condylomata.  PROCEDURE: 1. Examination under anesthesia. 2. Excision and fulguration of anal condylomata.  SURGEON:  Velora Heckler, MD, FACS  ANESTHESIA:  General per Dr. Autumn Patty.  ESTIMATED BLOOD LOSS:  Minimal.  PREPARATION:  None.  INDICATIONS:  The patient is a 27 year old black male with anal condylomata.  He has been evaluated in the office.  He now comes to surgery for excision under anesthesia.  BODY OF REPORT:  Procedures done in OR #6 at the [facility].  The patient was brought to the operating room, placed in the supine position on the operating room table.  Following administration of general anesthesia, the patient was placed in lithotomy.  Drapes were placed.  Suction is positioned.  Anoderm is slightly everted revealing multiple anal condylomata.  These range in size from 1 mm to 8 mm. Using the electrocautery, the larger lesions are completely excised. Smaller lesions are fulgurated with the electrocautery.  Larger lesions were collected and submitted to pathology as specimen.  Overall, there were approximately 9 lesions present at the anal verge, and in the anal canal.  Palpation in the anal canal on the lower rectum show no further palpable lesions.  Bacitracin ointment was applied topically.  Local anesthetic was injected circumferentially around the external anus.  ABD pad was placed as dressing.  The patient was taken out lithotomy and awakened from anesthesia.  He was brought to the recovery room.  The patient tolerated the procedure well.   Velora Heckler,  MD, [facility] Office: [phone]    TMG/MEDQ  D:  10/30/2012  T:  10/31/2012  Job:  [id number]  cc:   Ward Roxan Hockey, MD

## 2012-11-03 ENCOUNTER — Telehealth (INDEPENDENT_AMBULATORY_CARE_PROVIDER_SITE_OTHER): Payer: Self-pay | Admitting: General Surgery

## 2012-11-03 NOTE — Telephone Encounter (Signed)
Spoke with patient he has po f/u visit  12/01/12

## 2012-12-01 ENCOUNTER — Ambulatory Visit (INDEPENDENT_AMBULATORY_CARE_PROVIDER_SITE_OTHER): Payer: Self-pay | Admitting: Surgery

## 2012-12-01 ENCOUNTER — Encounter (INDEPENDENT_AMBULATORY_CARE_PROVIDER_SITE_OTHER): Payer: Self-pay | Admitting: Surgery

## 2012-12-01 VITALS — BP 110/62 | HR 60 | Temp 97.8°F | Resp 18 | Ht 74.0 in | Wt 185.0 lb

## 2012-12-01 DIAGNOSIS — A630 Anogenital (venereal) warts: Secondary | ICD-10-CM

## 2012-12-01 NOTE — Progress Notes (Signed)
General Surgery [facility]  Visit Diagnoses: 1. Perianal condylomata     HISTORY: Patient returns for postoperative visit having undergone excision and fulguration of anal condyloma to as an outpatient procedure. Patient experienced discomfort for approximately one week. He saw intermittent bleeding for approximately 2 weeks. Both of these problems now resolved completely and he is having no pain and has seen no further bleeding for several days.  EXAM: External examination shows normal anoderm. He version shows healed surgical wounds. No sign of residual condylomata are identified. Digital rectal exam shows normal tone. No palpable masses. No tenderness.  IMPRESSION: Status post excision and fulguration of anal condylomata  PLAN: Patient is counseled to monitor for recurrence. I have also told him that his partners will need to be treated otherwise he will have episodes of recurrent infection. He understands.  Patient will return as needed.  Velora Heckler, MD, FACS General & Endocrine Surgery [facility]

## 2012-12-01 NOTE — Patient Instructions (Signed)
Genital Warts Genital warts are a sexually transmitted infection. They may appear as small bumps on the tissues of the genital area. CAUSES  Genital warts are caused by a virus called human papillomavirus (HPV). HPV is the most common sexually transmitted disease (STD) and infection of the sex organs. This infection is spread by having unprotected sex with an infected person. It can be spread by vaginal, anal, and oral sex. Many people do not know they are infected. They may be infected for years without problems. However, even if they do not have problems, they can unknowingly pass the infection to their sexual partners. SYMPTOMS   Itching and irritation in the genital area.  Warts that bleed.  Painful sexual intercourse. DIAGNOSIS  Warts are usually recognized with the naked eye on the vagina, vulva, perineum, anus, and rectum. Certain tests can also diagnose genital warts, such as:  A Pap test.  A tissue sample (biopsy) exam.  Colposcopy. A magnifying tool is used to examine the vagina and cervix. The HPV cells will change color when certain solutions are used. TREATMENT  Warts can be removed by:  Applying certain chemicals, such as cantharidin or podophyllin.  Liquid nitrogen freezing (cryotherapy).  Immunotherapy with candida or trichophyton injections.  Laser treatment.  Burning with an electrified probe (electrocautery).  Interferon injections.  Surgery. PREVENTION  HPV vaccination can help prevent HPV infections that cause genital warts and that cause cancer of the cervix. It is recommended that the vaccination be given to people between the ages 9 to 26 years old. The vaccine might not work as well or might not work at all if you already have HPV. It should not be given to pregnant women. HOME CARE INSTRUCTIONS   It is important to follow your caregiver's instructions. The warts will not go away without treatment. Repeat treatments are often needed to get rid of warts.  Even after it appears that the warts are gone, the normal tissue underneath often remains infected.  Do not try to treat genital warts with medicine used to treat hand warts. This type of medicine is strong and can burn the skin in the genital area, causing more damage.  Tell your past and current sexual partner(s) that you have genital warts. They may be infected also and need treatment.  Avoid sexual contact while being treated.  Do not touch or scratch the warts. The infection may spread to other parts of your body.  Women with genital warts should have a cervical cancer check (Pap test) at least once a year. This type of cancer is slow-growing and can be cured if found early. Chances of developing cervical cancer are increased with HPV.  Inform your obstetrician about your warts in the event of pregnancy. This virus can be passed to the baby's respiratory tract. Discuss this with your caregiver.  Use a condom during sexual intercourse. Following treatment, the use of condoms will help prevent reinfection.  Ask your caregiver about using over-the-counter anti-itch creams. SEEK MEDICAL CARE IF:   Your treated skin becomes red, swollen, or painful.  You have a fever.  You feel generally ill.  You feel little lumps in and around your genital area.  You are bleeding or have painful sexual intercourse. MAKE SURE YOU:   Understand these instructions.  Will watch your condition.  Will get help right away if you are not doing well or get worse. Document Released: 08/10/2000 Document Revised: 11/05/2011 Document Reviewed: 02/19/2011 ExitCare Patient Information 2013 [employer].  

## 2013-01-13 ENCOUNTER — Emergency Department (HOSPITAL_COMMUNITY)
Admission: EM | Admit: 2013-01-13 | Discharge: 2013-01-13 | Disposition: A | Discharge status: Home / Self Care | Payer: Self-pay | Attending: Emergency Medicine | Admitting: Emergency Medicine

## 2013-01-13 ENCOUNTER — Encounter (HOSPITAL_COMMUNITY): Payer: Self-pay | Admitting: *Deleted

## 2013-01-13 ENCOUNTER — Emergency Department (HOSPITAL_COMMUNITY): Payer: Self-pay

## 2013-01-13 DIAGNOSIS — J45909 Unspecified asthma, uncomplicated: Secondary | ICD-10-CM

## 2013-01-13 DIAGNOSIS — J3489 Other specified disorders of nose and nasal sinuses: Secondary | ICD-10-CM | POA: Insufficient documentation

## 2013-01-13 DIAGNOSIS — F17200 Nicotine dependence, unspecified, uncomplicated: Secondary | ICD-10-CM | POA: Insufficient documentation

## 2013-01-13 DIAGNOSIS — J45901 Unspecified asthma with (acute) exacerbation: Secondary | ICD-10-CM | POA: Insufficient documentation

## 2013-01-13 DIAGNOSIS — R05 Cough: Secondary | ICD-10-CM | POA: Insufficient documentation

## 2013-01-13 DIAGNOSIS — Z79899 Other long term (current) drug therapy: Secondary | ICD-10-CM | POA: Insufficient documentation

## 2013-01-13 DIAGNOSIS — Z8679 Personal history of other diseases of the circulatory system: Secondary | ICD-10-CM | POA: Insufficient documentation

## 2013-01-13 DIAGNOSIS — R059 Cough, unspecified: Secondary | ICD-10-CM | POA: Insufficient documentation

## 2013-01-13 MED ORDER — IPRATROPIUM BROMIDE 0.02 % IN SOLN
0.5000 mg | Freq: Once | RESPIRATORY_TRACT | Status: AC
  Administered 2013-01-13: 0.5 mg via RESPIRATORY_TRACT
  Filled 2013-01-13: qty 2.5

## 2013-01-13 MED ORDER — ALBUTEROL SULFATE (5 MG/ML) 0.5% IN NEBU
2.5000 mg | INHALATION_SOLUTION | Freq: Once | RESPIRATORY_TRACT | Status: DC
  Filled 2013-01-13: qty 1

## 2013-01-13 MED ORDER — DEXAMETHASONE 6 MG PO TABS
10.0000 mg | ORAL_TABLET | Freq: Once | ORAL | Status: AC
  Administered 2013-01-13: 10 mg via ORAL
  Filled 2013-01-13: qty 1

## 2013-01-13 MED ORDER — ALBUTEROL SULFATE HFA 108 (90 BASE) MCG/ACT IN AERS
2.0000 | INHALATION_SPRAY | RESPIRATORY_TRACT | Status: DC | PRN
  Administered 2013-01-13: 2 via RESPIRATORY_TRACT
  Filled 2013-01-13: qty 6.7

## 2013-01-13 MED ORDER — ALBUTEROL SULFATE (5 MG/ML) 0.5% IN NEBU
5.0000 mg | INHALATION_SOLUTION | Freq: Once | RESPIRATORY_TRACT | Status: AC
  Administered 2013-01-13: 5 mg via RESPIRATORY_TRACT

## 2013-01-13 NOTE — ED Notes (Signed)
Pt states that he has been having trouble with his asthma today; used a neb earlier and felt some better but know feels like he id having trouble breathing again

## 2013-01-13 NOTE — ED Provider Notes (Signed)
History     CSN: [id number]  Arrival date & time 01/13/13  0036   First MD Initiated Contact with Patient 01/13/13 0120      Chief Complaint  Patient presents with  . Asthma    (Consider location/radiation/quality/duration/timing/severity/associated sxs/prior treatment) HPI This is a 27 year old male with a history of asthma. He is here with shortness of breath began about yesterday afternoon. He was able to use his albuterol inhaler wants with transient relief but his shortness of breath returned. He was unable to arrive to the hospital until this morning. He was given albuterol and Atrovent neb treatment on arrival with significant improvement in symptoms. His shortness of breath has been accompanied by nasal congestion and cough but no sore throat or fever. The symptoms were moderate at their worst.  Past Medical History  Diagnosis Date  . 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 Asthma     daily use of inhaler  . Headache     migraines    Past Surgical History  Procedure Laterality Date  . No past surgeries    . Wart fulguration  03/06/2012    Procedure: FULGURATION ANAL WART;  Surgeon: Velora Heckler, MD;  Location: [facility];  Service: General;  Laterality: N/A;  . Wart fulguration N/A 10/30/2012    Procedure: Fulgeration  Anal condylomata, Rectal exam under anesthesia;  Surgeon: Velora Heckler, MD;  Location: [facility];  Service: General;  Laterality: N/A;    No family history on file.  History  Substance Use Topics  . Smoking status: Current Every Day Smoker -- 1 years    Types: Cigars  . Smokeless tobacco: Never Used     Comment: smokes Black and Milds - 1/day  . Alcohol Use: Yes     Comment: occasionally      Review of Systems  All other systems reviewed and are negative.    Allergies  Review of patient's allergies indicates no known allergies.  Home Medications   Current Outpatient Rx  Name  Route  Sig  Dispense  Refill  .  diphenhydrAMINE (BENADRYL) 25 MG tablet   Oral   Take 25 mg by mouth every 6 (six) hours as needed for itching.           BP 134/70  Pulse 92  Temp(Src) 98.1 F (36.7 C) (Oral)  Resp 18  Ht 6\' 1"  (1.854 m)  Wt 170 lb (77.111 kg)  BMI 22.43 kg/m2  SpO2 94%  Physical Exam General: Well-developed, well-nourished male in no acute distress; appearance consistent with age of record HENT: normocephalic, atraumatic Eyes: pupils equal round and reactive to light; extraocular muscles intact Neck: supple Heart: regular rate and rhythm Lungs: Very faint expiratory wheezes Abdomen: soft; nondistended Extremities: No deformity; full range of motion Neurologic: Awake, alert and oriented; motor function intact in all extremities and symmetric; no facial droop Skin: Warm and dry Psychiatric: Normal mood and affect    ED Course  Procedures (including critical care time)     MDM          Hanley Seamen, MD 01/13/13 0130

## 2013-01-16 ENCOUNTER — Emergency Department (HOSPITAL_COMMUNITY): Payer: No Typology Code available for payment source

## 2013-01-16 ENCOUNTER — Encounter (HOSPITAL_COMMUNITY): Payer: Self-pay | Admitting: Emergency Medicine

## 2013-01-16 ENCOUNTER — Emergency Department (HOSPITAL_COMMUNITY)
Admission: EM | Admit: 2013-01-16 | Discharge: 2013-01-16 | Disposition: A | Discharge status: Home / Self Care | Payer: No Typology Code available for payment source | Attending: Emergency Medicine | Admitting: Emergency Medicine

## 2013-01-16 DIAGNOSIS — Z8619 Personal history of other infectious and parasitic diseases: Secondary | ICD-10-CM | POA: Insufficient documentation

## 2013-01-16 DIAGNOSIS — T07XXXA Unspecified multiple injuries, initial encounter: Secondary | ICD-10-CM | POA: Insufficient documentation

## 2013-01-16 DIAGNOSIS — Z8669 Personal history of other diseases of the nervous system and sense organs: Secondary | ICD-10-CM | POA: Insufficient documentation

## 2013-01-16 DIAGNOSIS — J45909 Unspecified asthma, uncomplicated: Secondary | ICD-10-CM | POA: Insufficient documentation

## 2013-01-16 DIAGNOSIS — Y92410 Unspecified street and highway as the place of occurrence of the external cause: Secondary | ICD-10-CM | POA: Insufficient documentation

## 2013-01-16 DIAGNOSIS — F17200 Nicotine dependence, unspecified, uncomplicated: Secondary | ICD-10-CM | POA: Insufficient documentation

## 2013-01-16 DIAGNOSIS — Y9389 Activity, other specified: Secondary | ICD-10-CM | POA: Insufficient documentation

## 2013-01-16 MED ORDER — HYDROCODONE-ACETAMINOPHEN 5-325 MG PO TABS
2.0000 | ORAL_TABLET | Freq: Once | ORAL | Status: AC
  Administered 2013-01-16: 2 via ORAL
  Filled 2013-01-16: qty 2

## 2013-01-16 MED ORDER — IBUPROFEN 800 MG PO TABS
800.0000 mg | ORAL_TABLET | Freq: Once | ORAL | Status: AC
  Administered 2013-01-16: 800 mg via ORAL
  Filled 2013-01-16: qty 1

## 2013-01-16 MED ORDER — HYDROCODONE-ACETAMINOPHEN 5-325 MG PO TABS: 2.0000 | ORAL_TABLET | ORAL | Status: DC | PRN

## 2013-01-16 MED ORDER — IBUPROFEN 800 MG PO TABS: 800.0000 mg | ORAL_TABLET | Freq: Three times a day (TID) | ORAL | Status: DC

## 2013-01-16 NOTE — ED Notes (Signed)
Patient transported to X-ray 

## 2013-01-16 NOTE — Progress Notes (Signed)
[facility] has seen patient and provided him with a OC application. ° °
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
## 2013-03-17 ENCOUNTER — Encounter (HOSPITAL_COMMUNITY): Payer: Self-pay | Admitting: *Deleted

## 2013-03-17 ENCOUNTER — Emergency Department (HOSPITAL_COMMUNITY)
Admission: EM | Admit: 2013-03-17 | Discharge: 2013-03-17 | Disposition: A | Discharge status: Home / Self Care | Payer: Self-pay | Attending: Emergency Medicine | Admitting: Emergency Medicine

## 2013-03-17 ENCOUNTER — Emergency Department (HOSPITAL_COMMUNITY): Payer: Self-pay

## 2013-03-17 DIAGNOSIS — J45901 Unspecified asthma with (acute) exacerbation: Secondary | ICD-10-CM | POA: Insufficient documentation

## 2013-03-17 DIAGNOSIS — J4531 Mild persistent asthma with (acute) exacerbation: Secondary | ICD-10-CM

## 2013-03-17 DIAGNOSIS — F17200 Nicotine dependence, unspecified, uncomplicated: Secondary | ICD-10-CM | POA: Insufficient documentation

## 2013-03-17 DIAGNOSIS — R05 Cough: Secondary | ICD-10-CM | POA: Insufficient documentation

## 2013-03-17 DIAGNOSIS — J3489 Other specified disorders of nose and nasal sinuses: Secondary | ICD-10-CM | POA: Insufficient documentation

## 2013-03-17 DIAGNOSIS — Z8619 Personal history of other infectious and parasitic diseases: Secondary | ICD-10-CM | POA: Insufficient documentation

## 2013-03-17 DIAGNOSIS — R059 Cough, unspecified: Secondary | ICD-10-CM | POA: Insufficient documentation

## 2013-03-17 DIAGNOSIS — Z79899 Other long term (current) drug therapy: Secondary | ICD-10-CM | POA: Insufficient documentation

## 2013-03-17 MED ORDER — PREDNISONE 20 MG PO TABS
40.0000 mg | ORAL_TABLET | Freq: Once | ORAL | Status: AC
  Administered 2013-03-17: 40 mg via ORAL
  Filled 2013-03-17: qty 2

## 2013-03-17 MED ORDER — ALBUTEROL SULFATE HFA 108 (90 BASE) MCG/ACT IN AERS
1.0000 | INHALATION_SPRAY | RESPIRATORY_TRACT | Status: DC | PRN
  Administered 2013-03-17: 2 via RESPIRATORY_TRACT
  Filled 2013-03-17: qty 6.7

## 2013-03-17 MED ORDER — FEXOFENADINE-PSEUDOEPHED ER 60-120 MG PO TB12: 1.0000 | ORAL_TABLET | Freq: Two times a day (BID) | ORAL | Status: DC

## 2013-03-17 MED ORDER — PREDNISONE 20 MG PO TABS: 40.0000 mg | ORAL_TABLET | Freq: Every day | ORAL | Status: DC

## 2013-03-17 MED ORDER — ALBUTEROL SULFATE (5 MG/ML) 0.5% IN NEBU
5.0000 mg | INHALATION_SOLUTION | RESPIRATORY_TRACT | Status: DC
  Administered 2013-03-17: 5 mg via RESPIRATORY_TRACT
  Filled 2013-03-17: qty 1

## 2013-03-17 MED ORDER — IBUPROFEN 800 MG PO TABS
800.0000 mg | ORAL_TABLET | Freq: Once | ORAL | Status: AC
  Administered 2013-03-17: 800 mg via ORAL
  Filled 2013-03-17: qty 1

## 2013-03-17 MED ORDER — AEROCHAMBER PLUS FLO-VU LARGE MISC
1.0000 | Freq: Once | Status: AC
  Administered 2013-03-17: 1

## 2013-03-17 MED ORDER — IPRATROPIUM BROMIDE 0.02 % IN SOLN
0.5000 mg | RESPIRATORY_TRACT | Status: DC
  Administered 2013-03-17: 0.5 mg via RESPIRATORY_TRACT
  Filled 2013-03-17: qty 2.5

## 2013-03-17 MED ORDER — ALBUTEROL SULFATE (5 MG/ML) 0.5% IN NEBU
2.5000 mg | INHALATION_SOLUTION | Freq: Once | RESPIRATORY_TRACT | Status: AC
  Administered 2013-03-17: 2.5 mg via RESPIRATORY_TRACT
  Filled 2013-03-17: qty 0.5

## 2013-03-17 NOTE — ED Notes (Signed)
C/o SOB x 1 day- Hx of asthma- chest discomfort with breathing- out of inhaler

## 2013-03-17 NOTE — ED Provider Notes (Signed)
History    CSN: [id number] Arrival date & time 03/17/13  0149  First MD Initiated Contact with Patient 03/17/13 0211     Chief Complaint  Patient presents with  . Asthma   HPI Zaylin Runco is a 27 y.o. male with a history of asthma, he's been out of his albuterol inhaler, is complaining of shortness of breath times one day. He says his chest is tight, this is mild to moderate, is been going on for the last day, he's not been able to use his inhaler to feel better. He's taken nothing else for it. He's had some associated cough or he is coughing up "cold" without color, no hemoptysis, no history of venous thromboembolic disease. Patient's chest pain is in the center of his chest described as a tightness is been associated with his asthma. Patient's also had some congestion in his nose, sinuses, and years. No fever, no chills, no abdominal pain, nausea vomiting or diarrhea.  Past Medical History  Diagnosis Date  . 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 Asthma     daily use of inhaler  . Headache(784.0)     migraines   Past Surgical History  Procedure Laterality Date  . No past surgeries    . Wart fulguration  03/06/2012    Procedure: FULGURATION ANAL WART;  Surgeon: Velora Heckler, MD;  Location: [facility];  Service: General;  Laterality: N/A;  . Wart fulguration N/A 10/30/2012    Procedure: Fulgeration  Anal condylomata, Rectal exam under anesthesia;  Surgeon: Velora Heckler, MD;  Location: [facility];  Service: General;  Laterality: N/A;   No family history on file. History  Substance Use Topics  . Smoking status: Current Every Day Smoker -- 1 years    Types: Cigars  . Smokeless tobacco: Never Used     Comment: smokes Black and Milds - 1/day  . Alcohol Use: 0.5 oz/week    1 drink(s) per week     Comment: occasionally    Review of Systems At least 10pt or greater review of systems completed and are negative except where specified in the HPI.  Allergies  Review  of patient's allergies indicates no known allergies.  Home Medications   Current Outpatient Rx  Name  Route  Sig  Dispense  Refill  . albuterol (PROVENTIL HFA;VENTOLIN HFA) 108 (90 BASE) MCG/ACT inhaler   Inhalation   Inhale 2 puffs into the lungs every 6 (six) hours as needed for wheezing (wheezing, sob).         Marland Kitchen ibuprofen (ADVIL,MOTRIN) 800 MG tablet   Oral   Take 1 tablet (800 mg total) by mouth 3 (three) times daily.   21 tablet   0    BP 133/76  Pulse 104  Temp(Src) 99 F (37.2 C) (Oral)  Resp 24  Ht 6\' 1"  (1.854 m)  Wt 180 lb (81.647 kg)  BMI 23.75 kg/m2  SpO2 92% Physical Exam  Nursing notes reviewed.  Electronic medical record reviewed. VITAL SIGNS:   Filed Vitals:   03/17/13 0153  BP: 133/76  Pulse: 104  Temp: 99 F (37.2 C)  TempSrc: Oral  Resp: 24  Height: 6\' 1"  (1.854 m)  Weight: 180 lb (81.647 kg)  SpO2: 92%   CONSTITUTIONAL: Awake, oriented, appears non-toxic HENT: Atraumatic, normocephalic, oral mucosa pink and moist, airway patent. Nares patent with clear congestion, turbinates are mildly boggy. External ears normal. Mucous effusions behind bilateral TMs with mild erythema to both tympanic membranes.  EYES: Conjunctiva clear, EOMI, PERRLA NECK: Trachea midline, non-tender, supple CARDIOVASCULAR: Normal heart rate, Normal rhythm, No murmurs, rubs, gallops PULMONARY/CHEST: Fair air movement, wheezing bilaterally.  Non-tender. ABDOMINAL: Non-distended, soft, non-tender - no rebound or guarding.  BS normal. NEUROLOGIC: Non-focal, moving all four extremities, no gross sensory or motor deficits. EXTREMITIES: No clubbing, cyanosis, or edema SKIN: Warm, Dry, No erythema, No rash  ED Course  Procedures (including critical care time) Labs Reviewed - No data to display Dg Chest 2 View  03/17/2013   *RADIOLOGY REPORT*  Clinical Data: Bronchospasm, shortness of breath  CHEST - 2 VIEW  Comparison: Prior radiograph from 01/16/2013  Findings: Cardiac and  mediastinal silhouettes are stable in size and contour, and remain within normal limits.  The lungs are well inflated.  No consolidation, pleural effusion, or pulmonary edema.  No pneumothorax.  No acute osseous abnormality.  IMPRESSION: No acute cardiopulmonary process.   Original Report Authenticated By: Rise Mu, M.D.   1. Asthma exacerbation attacks, mild persistent     MDM  Philipe Laswell is a 27 y.o. male with a history of asthma who presents with mild asthma exacerbation. Patient does have a respiratory infection, upper, but secondary to poor breath sounds overly with wheezing has obtained a chest x-ray. No pneumonia is seen on the chest x-ray. No pneumothorax. A very low suspicion for venous thromboembolic disease, patient is PERC negative.  Patient does have some chest tightness I think is secondary to asthma - do not think this is acute coronary syndrome in otherwise healthy 27 year old.  Patient reevaluated after one DuoNeb treatment, he still whee[facility] and chest tightness, give him another 5 mg albuterol treatments which she feels much better. He says his breathing is almost back to normal. Patient's been given steroids in the emergency department, albuterol inhaler and instructions to use it with a spacer which is also provided.    To return to the emergency department for any worsening shortness of breath, worsening of his asthma, any chest pains, high fever, chills or any other concerning symptoms.   Jones Skene, MD 03/17/13 2333

## 2013-07-20 ENCOUNTER — Encounter (HOSPITAL_COMMUNITY): Payer: Self-pay | Admitting: Emergency Medicine

## 2013-07-20 ENCOUNTER — Emergency Department (HOSPITAL_COMMUNITY)
Admission: EM | Admit: 2013-07-20 | Discharge: 2013-07-20 | Disposition: A | Discharge status: Home / Self Care | Payer: Self-pay | Attending: Emergency Medicine | Admitting: Emergency Medicine

## 2013-07-20 ENCOUNTER — Emergency Department (HOSPITAL_COMMUNITY): Payer: Self-pay

## 2013-07-20 DIAGNOSIS — J189 Pneumonia, unspecified organism: Secondary | ICD-10-CM

## 2013-07-20 DIAGNOSIS — J45901 Unspecified asthma with (acute) exacerbation: Secondary | ICD-10-CM | POA: Insufficient documentation

## 2013-07-20 DIAGNOSIS — Z79899 Other long term (current) drug therapy: Secondary | ICD-10-CM | POA: Insufficient documentation

## 2013-07-20 DIAGNOSIS — J159 Unspecified bacterial pneumonia: Secondary | ICD-10-CM | POA: Insufficient documentation

## 2013-07-20 DIAGNOSIS — G43909 Migraine, unspecified, not intractable, without status migrainosus: Secondary | ICD-10-CM | POA: Insufficient documentation

## 2013-07-20 DIAGNOSIS — Z8619 Personal history of other infectious and parasitic diseases: Secondary | ICD-10-CM | POA: Insufficient documentation

## 2013-07-20 DIAGNOSIS — F17200 Nicotine dependence, unspecified, uncomplicated: Secondary | ICD-10-CM | POA: Insufficient documentation

## 2013-07-20 LAB — POCT I-STAT TROPONIN I: Troponin i, poc: 0 ng/mL (ref 0.00–0.08)

## 2013-07-20 LAB — POCT I-STAT, CHEM 8
HCT: 51 % (ref 39.0–52.0)
Hemoglobin: 17.3 g/dL — ABNORMAL HIGH (ref 13.0–17.0)
Sodium: 139 mEq/L (ref 135–145)
TCO2: 25 mmol/L (ref 0–100)

## 2013-07-20 LAB — D-DIMER, QUANTITATIVE: D-Dimer, Quant: <0.27 ug/mL-FEU (ref 0.00–0.48)

## 2013-07-20 MED ORDER — IBUPROFEN 200 MG PO TABS
400.0000 mg | ORAL_TABLET | Freq: Once | ORAL | Status: AC
  Administered 2013-07-20: 400 mg via ORAL
  Filled 2013-07-20: qty 2

## 2013-07-20 MED ORDER — IPRATROPIUM BROMIDE 0.02 % IN SOLN
RESPIRATORY_TRACT | Status: AC
  Filled 2013-07-20: qty 5

## 2013-07-20 MED ORDER — DOXYCYCLINE HYCLATE 100 MG PO TABS: 100.0000 mg | ORAL_TABLET | Freq: Two times a day (BID) | ORAL | Status: DC

## 2013-07-20 MED ORDER — PREDNISONE 20 MG PO TABS
60.0000 mg | ORAL_TABLET | Freq: Once | ORAL | Status: AC
  Administered 2013-07-20: 60 mg via ORAL
  Filled 2013-07-20: qty 3

## 2013-07-20 MED ORDER — ALBUTEROL (5 MG/ML) CONTINUOUS INHALATION SOLN
15.0000 mg/h | INHALATION_SOLUTION | Freq: Once | RESPIRATORY_TRACT | Status: AC
  Administered 2013-07-20: 15 mg/h via RESPIRATORY_TRACT
  Filled 2013-07-20: qty 20

## 2013-07-20 MED ORDER — ACETAMINOPHEN 325 MG PO TABS
650.0000 mg | ORAL_TABLET | Freq: Once | ORAL | Status: AC
  Administered 2013-07-20: 650 mg via ORAL
  Filled 2013-07-20: qty 2

## 2013-07-20 MED ORDER — ALBUTEROL SULFATE (5 MG/ML) 0.5% IN NEBU: 5.0000 mg | INHALATION_SOLUTION | Freq: Once | RESPIRATORY_TRACT | Status: DC

## 2013-07-20 MED ORDER — IPRATROPIUM BROMIDE 0.02 % IN SOLN
1.0000 mg | Freq: Once | RESPIRATORY_TRACT | Status: AC
  Administered 2013-07-20: 1 mg via RESPIRATORY_TRACT
  Filled 2013-07-20: qty 5

## 2013-07-20 MED ORDER — DOXYCYCLINE HYCLATE 100 MG PO TABS
100.0000 mg | ORAL_TABLET | Freq: Once | ORAL | Status: AC
  Administered 2013-07-20: 100 mg via ORAL
  Filled 2013-07-20: qty 1

## 2013-07-20 MED ORDER — ALBUTEROL SULFATE HFA 108 (90 BASE) MCG/ACT IN AERS
2.0000 | INHALATION_SPRAY | RESPIRATORY_TRACT | Status: AC
  Administered 2013-07-20: 2 via RESPIRATORY_TRACT
  Filled 2013-07-20: qty 6.7

## 2013-07-20 MED ORDER — PREDNISONE 20 MG PO TABS: 40.0000 mg | ORAL_TABLET | Freq: Every day | ORAL | Status: DC

## 2013-07-20 NOTE — ED Notes (Signed)
Respiratory called for inhaler and MDI teaching

## 2013-07-20 NOTE — ED Notes (Signed)
Pt from home with shortness of breath since last p.m. He has a  History of asthma. Bilateral lung sounds inspiratory and expiratory wheezing.

## 2013-07-20 NOTE — Progress Notes (Signed)
[facility] provided pt with a list of primary care resources.  °

## 2013-07-20 NOTE — ED Notes (Signed)
Phlebotomy at bedside.

## 2013-07-20 NOTE — ED Provider Notes (Signed)
CSN: [id number]     Arrival date & time 07/20/13  1350 History   First MD Initiated Contact with Patient 07/20/13 1413     Chief Complaint  Patient presents with  . Shortness of Breath  . Asthma    HPI Pt was seen at 1410.  Per pt, c/o gradual onset and worsening of persistent cough, wheezing and SOB for the past 2 days. Has been associated with constant generalized chest "tightness" and "pain" which worsens with coughing. Describes his symptoms as "my asthma is acting up."  Pt states he ran out of his MDI. Endorses brief syncopal episode at home PTA. States he "was standing there talking to my mom and just passed out because I was so winded." No reported seizure activity, no incont of bowel/bladder, no tongue biting, no confusion/AMS.  Denies CP/palpitations, no back pain, no abd pain, no N/V/D, no fevers, no rash, no headache, no neck or back pain, no calf/LE pain or unilateral swelling.    Past Medical History  Diagnosis Date  . 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 Asthma     daily use of inhaler  . Headache(784.0)     migraines   Past Surgical History  Procedure Laterality Date  . No past surgeries    . Wart fulguration  03/06/2012    Procedure: FULGURATION ANAL WART;  Surgeon: Velora Heckler, MD;  Location: [facility];  Service: General;  Laterality: N/A;  . Wart fulguration N/A 10/30/2012    Procedure: Fulgeration  Anal condylomata, Rectal exam under anesthesia;  Surgeon: Velora Heckler, MD;  Location: [facility];  Service: General;  Laterality: N/A;    History  Substance Use Topics  . Smoking status: Current Every Day Smoker -- 1 years    Types: Cigars  . Smokeless tobacco: Never Used     Comment: smokes Black and Milds - 1/day  . Alcohol Use: 0.5 oz/week    1 drink(s) per week     Comment: occasionally    Review of Systems ROS: Statement: All systems negative except as marked or noted in the HPI; Constitutional: Negative for fever and chills. ; ; Eyes:  Negative for eye pain, redness and discharge. ; ; ENMT: Negative for ear pain, hoarseness, nasal congestion, sinus pressure and sore throat. ; ; Cardiovascular: Negative for chest pain, palpitations, diaphoresis, and peripheral edema. ; ; Respiratory: +cough, wheezing, SOB. Negative for stridor. ; ; Gastrointestinal: Negative for nausea, vomiting, diarrhea, abdominal pain, blood in stool, hematemesis, jaundice and rectal bleeding. . ; ; Genitourinary: Negative for dysuria, flank pain and hematuria. ; ; Musculoskeletal: Negative for back pain and neck pain. Negative for swelling and trauma.; ; Skin: Negative for pruritus, rash, abrasions, blisters, bruising and skin lesion.; ; Neuro: Negative for headache, lightheadedness and neck stiffness. Negative for weakness, altered level of consciousness , altered mental status, extremity weakness, paresthesias, involuntary movement, seizure and +syncope.     Allergies  Review of patient's allergies indicates no known allergies.  Home Medications   Current Outpatient Rx  Name  Route  Sig  Dispense  Refill  . albuterol (PROVENTIL HFA;VENTOLIN HFA) 108 (90 BASE) MCG/ACT inhaler   Inhalation   Inhale 2 puffs into the lungs every 6 (six) hours as needed for wheezing (wheezing, sob).         . Aspirin-Acetaminophen-Caffeine (EXCEDRIN EXTRA STRENGTH PO)   Oral   Take 2 tablets by mouth every 6 (six) hours as needed (pain, headache).         Marland Kitchen  ibuprofen (ADVIL,MOTRIN) 200 MG tablet   Oral   Take 200-400 mg by mouth every 6 (six) hours as needed for moderate pain.          BP 138/70  Pulse 114  Temp(Src) 99.2 F (37.3 C) (Oral)  Resp 20  SpO2 95% Physical Exam 1415: Physical examination:  Nursing notes reviewed; Vital signs and O2 SAT reviewed;  Constitutional: Well developed, Well nourished, Well hydrated, In no acute distress; Head:  Normocephalic, atraumatic; Eyes: EOMI, PERRL, No scleral icterus; ENMT: TM's clear bilat. +edemetous nasal  turbinates bilat with clear rhinorrhea. Mouth and pharynx normal, Mucous membranes moist; Neck: Supple, Full range of motion, No lymphadenopathy; Cardiovascular: Tachycardic rate and rhythm, No gallop; Respiratory: Breath sounds diminished & equal bilaterally, insp/exp wheezes bilat. No audible wheezing. Speaking in sentences. No retrax or access mm use. Normal respiratory effort/excursion; Chest: No deformity, no rash, Movement normal; Abdomen: Soft, Nontender, Nondistended, Normal bowel sounds; Genitourinary: No CVA tenderness; Extremities: Pulses normal, No tenderness, No edema, No calf edema or asymmetry.; Neuro: AA&Ox3, Major CN grossly intact. No facial droop. Speech clear. Using cellphone without difficulty. No gross focal motor or sensory deficits in extremities.; Skin: Color normal, Warm, Dry.   ED Course  Procedures   EKG Interpretation    Date/Time:  Monday July 20 2013 14:02:06 EST Ventricular Rate:  113 PR Interval:  160 QRS Duration: 76 QT Interval:  306 QTC Calculation: 419 R Axis:   93 Text Interpretation:  Sinus tachycardia Biatrial enlargement Borderline right axis deviation Early repolarization pattern No significant change since last tracing 05/21/2004 Confirmed by [facility]  MD, Nicholos Johns [phone]) on 07/20/2013 2:26:37 PM            MDM  MDM Reviewed: previous chart, nursing note and vitals Reviewed previous: labs and ECG Interpretation: labs, ECG and x-ray     Results for orders placed during the hospital encounter of 07/20/13  D-DIMER, QUANTITATIVE      Result Value Range   D-Dimer, Quant <0.27  0.00 - 0.48 ug/mL-FEU  POCT I-STAT, CHEM 8      Result Value Range   Sodium 139  135 - 145 mEq/L   Potassium 4.1  3.5 - 5.1 mEq/L   Chloride 103  96 - 112 mEq/L   BUN 10  6 - 23 mg/dL   Creatinine, Ser 9.60 (*) 0.50 - 1.35 mg/dL   Glucose, Bld 90  70 - 99 mg/dL   Calcium, Ion 4.54  0.98 - 1.23 mmol/L   TCO2 25  0 - 100 mmol/L   Hemoglobin 17.3 (*) 13.0 -  17.0 g/dL   HCT 11.9  14.7 - 82.9 %  POCT I-STAT TROPONIN I      Result Value Range   Troponin i, poc 0.00  0.00 - 0.08 ng/mL   Comment 3             Dg Chest Portable 1 View 07/20/2013   CLINICAL DATA:  Chest pain and shortness of  EXAM: PORTABLE CHEST - 1 VIEW  COMPARISON:  03/17/2013  FINDINGS: Cardiac shadow is within normal limits. The lungs are well aerated bilaterally. Some mild increased density is noted in the left lower lung field. This may represent some very early infiltrate. No pneumothorax or sizable effusion is seen. No acute bony abnormality is noted.  IMPRESSION: Questionable early infiltrate in the left base. Followup films are recommended.   Electronically Signed   By: Alcide Clever M.D.   On: 07/20/2013 15:12  1545:  PO prednisone given and hour long neb completed. Will dose doxycycline for early CAP; also dose MDI here. Pt states he "feels better" after neb and steroid and wants to go home now.  NAD, lungs CTA bilat, moist cough, no further wheezing, resps easy, speaking full sentences, Sats 98% R/A. Doubt PE as cause for chest pain symptom with normal d-dimer and low risk Wells.  Doubt ACS as cause for chest pain symptom with normal troponin and unchanged EKG from previous after 2 days of constant symptoms. Dx and testing d/w pt.  Questions answered.  Verb understanding, agreeable to d/c home with outpt f/u.    Laray Anger, DO 07/22/13 2129

## 2013-07-20 NOTE — ED Notes (Signed)
MD at bedside. 

## 2013-07-20 NOTE — ED Notes (Signed)
Respiratory at bedside.

## 2013-12-19 ENCOUNTER — Emergency Department (HOSPITAL_COMMUNITY)
Admission: EM | Admit: 2013-12-19 | Discharge: 2013-12-20 | Disposition: A | Discharge status: Home / Self Care | Payer: Self-pay | Attending: Emergency Medicine | Admitting: Emergency Medicine

## 2013-12-19 ENCOUNTER — Encounter (HOSPITAL_COMMUNITY): Payer: Self-pay | Admitting: Emergency Medicine

## 2013-12-19 DIAGNOSIS — Z792 Long term (current) use of antibiotics: Secondary | ICD-10-CM | POA: Insufficient documentation

## 2013-12-19 DIAGNOSIS — J45901 Unspecified asthma with (acute) exacerbation: Secondary | ICD-10-CM | POA: Insufficient documentation

## 2013-12-19 DIAGNOSIS — Z8669 Personal history of other diseases of the nervous system and sense organs: Secondary | ICD-10-CM | POA: Insufficient documentation

## 2013-12-19 DIAGNOSIS — Z79899 Other long term (current) drug therapy: Secondary | ICD-10-CM | POA: Insufficient documentation

## 2013-12-19 DIAGNOSIS — IMO0002 Reserved for concepts with insufficient information to code with codable children: Secondary | ICD-10-CM | POA: Insufficient documentation

## 2013-12-19 DIAGNOSIS — F17200 Nicotine dependence, unspecified, uncomplicated: Secondary | ICD-10-CM | POA: Insufficient documentation

## 2013-12-19 DIAGNOSIS — Z8619 Personal history of other infectious and parasitic diseases: Secondary | ICD-10-CM | POA: Insufficient documentation

## 2013-12-19 DIAGNOSIS — R071 Chest pain on breathing: Secondary | ICD-10-CM | POA: Insufficient documentation

## 2013-12-19 DIAGNOSIS — R0789 Other chest pain: Secondary | ICD-10-CM

## 2013-12-19 MED ORDER — PREDNISONE 20 MG PO TABS
60.0000 mg | ORAL_TABLET | Freq: Once | ORAL | Status: AC
  Administered 2013-12-20: 60 mg via ORAL
  Filled 2013-12-19: qty 3

## 2013-12-19 MED ORDER — IPRATROPIUM-ALBUTEROL 0.5-2.5 (3) MG/3ML IN SOLN
3.0000 mL | Freq: Once | RESPIRATORY_TRACT | Status: AC
  Administered 2013-12-20: 3 mL via RESPIRATORY_TRACT
  Filled 2013-12-19: qty 3

## 2013-12-19 NOTE — ED Notes (Signed)
patient states he had an asthma flare up today, used his nebulizer as he is out of his MDI. Patient states after that he went to sleep and woke up with chest tightness.

## 2013-12-19 NOTE — ED Notes (Signed)
Patient transported to X-ray 

## 2013-12-20 ENCOUNTER — Emergency Department (HOSPITAL_COMMUNITY): Payer: Self-pay

## 2013-12-20 MED ORDER — ALBUTEROL SULFATE HFA 108 (90 BASE) MCG/ACT IN AERS
2.0000 | INHALATION_SPRAY | Freq: Once | RESPIRATORY_TRACT | Status: AC
  Administered 2013-12-20: 2 via RESPIRATORY_TRACT
  Filled 2013-12-20: qty 6.7

## 2013-12-20 MED ORDER — PREDNISONE 20 MG PO TABS: 60.0000 mg | ORAL_TABLET | Freq: Every day | ORAL | Status: DC

## 2013-12-20 MED ORDER — ALBUTEROL SULFATE HFA 108 (90 BASE) MCG/ACT IN AERS: 1.0000 | INHALATION_SPRAY | RESPIRATORY_TRACT | Status: DC | PRN

## 2013-12-20 MED ORDER — TRAMADOL HCL 50 MG PO TABS: 50.0000 mg | ORAL_TABLET | Freq: Four times a day (QID) | ORAL | Status: DC | PRN

## 2013-12-20 MED ORDER — TRAMADOL HCL 50 MG PO TABS
50.0000 mg | ORAL_TABLET | Freq: Four times a day (QID) | ORAL | Status: DC | PRN
Start: 2013-12-20 — End: 2013-12-20
  Administered 2013-12-20: 50 mg via ORAL
  Filled 2013-12-20: qty 1

## 2013-12-20 NOTE — ED Provider Notes (Signed)
CSN: [id number]     Arrival date & time 12/19/13  2330 History   First MD Initiated Contact with Patient 12/19/13 2352     Chief Complaint  Patient presents with  . Asthma  . Chest Pain     (Consider location/radiation/quality/duration/timing/severity/associated sxs/prior Treatment) HPI 28 year old male presents to emergency room from home with complaint of asthma exacerbation.  Patient has run out of his MDI.  He does not have a primary care Dr.  He reports he is nonsmoker.  No fevers chills.  Patient has some anterior chest wall pain that started with the asthma exacerbation and cough.  Cough is nonproductive.  Nursing note states he has a nebulizer, patient denies this, reports the treatment prior to arrival. Past Medical History  Diagnosis Date  . 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 Asthma     daily use of inhaler  . Headache(784.0)     migraines   Past Surgical History  Procedure Laterality Date  . No past surgeries    . Wart fulguration  03/06/2012    Procedure: FULGURATION ANAL WART;  Surgeon: Velora Hecklerodd M Gerkin, MD;  Location: [facility];  Service: General;  Laterality: N/A;  . Wart fulguration N/A 10/30/2012    Procedure: Fulgeration  Anal condylomata, Rectal exam under anesthesia;  Surgeon: Velora Hecklerodd M Gerkin, MD;  Location: [facility];  Service: General;  Laterality: N/A;   No family history on file. History  Substance Use Topics  . Smoking status: Current Every Day Smoker -- 1 years    Types: Cigars  . Smokeless tobacco: Never Used     Comment: smokes Black and Milds - 1/day  . Alcohol Use: 0.5 oz/week    1 drink(s) per week     Comment: occasionally    Review of Systems   See History of Present Illness; otherwise all other systems are reviewed and negative  Allergies  Review of patient's allergies indicates no known allergies.  Home Medications   Prior to Admission medications   Medication Sig Start Date End Date Taking? Authorizing Provider   albuterol (PROVENTIL HFA;VENTOLIN HFA) 108 (90 BASE) MCG/ACT inhaler Inhale 2 puffs into the lungs every 6 (six) hours as needed for wheezing (wheezing, sob).    Historical Provider, MD  Aspirin-Acetaminophen-Caffeine (EXCEDRIN EXTRA STRENGTH PO) Take 2 tablets by mouth every 6 (six) hours as needed (pain, headache).    Historical Provider, MD  doxycycline (VIBRA-TABS) 100 MG tablet Take 1 tablet (100 mg total) by mouth 2 (two) times daily. 07/20/13   Laray AngerKathleen M McManus, DO  ibuprofen (ADVIL,MOTRIN) 200 MG tablet Take 200-400 mg by mouth every 6 (six) hours as needed for moderate pain.    Historical Provider, MD  predniSONE (DELTASONE) 20 MG tablet Take 2 tablets (40 mg total) by mouth daily. Start 07/21/2013 07/20/13   Laray AngerKathleen M McManus, DO   BP 119/80  Pulse 94  Temp(Src) 98 F (36.7 C) (Oral)  Resp 11  Ht 6\' 2"  (1.88 m)  Wt 185 lb (83.915 kg)  BMI 23.74 kg/m2  SpO2 96% Physical Exam  Nursing note and vitals reviewed. Constitutional: He is oriented to person, place, and time. He appears well-developed and well-nourished. No distress.  HENT:  Head: Normocephalic and atraumatic.  Nose: Nose normal.  Mouth/Throat: Oropharynx is clear and moist.  Eyes: Conjunctivae and EOM are normal. Pupils are equal, round, and reactive to light.  Neck: Normal range of motion. Neck supple. No JVD present. No tracheal deviation present. No thyromegaly present.  Cardiovascular: Normal rate, regular rhythm, normal heart sounds and intact distal pulses.  Exam reveals no gallop and no friction rub.   No murmur heard. Pulmonary/Chest: Effort normal. No stridor. No respiratory distress. He has wheezes. He has no rales. He exhibits no tenderness.  Abdominal: Soft. Bowel sounds are normal. He exhibits no distension and no mass. There is no tenderness. There is no rebound and no guarding.  Musculoskeletal: Normal range of motion. He exhibits no edema and no tenderness.  Lymphadenopathy:    He has no cervical  adenopathy.  Neurological: He is alert and oriented to person, place, and time. He exhibits normal muscle tone. Coordination normal.  Skin: Skin is warm and dry. No rash noted. No erythema. No pallor.  Psychiatric: He has a normal mood and affect. His behavior is normal. Judgment and thought content normal.    ED Course  Procedures (including critical care time) Labs Review Labs Reviewed - No data to display  Imaging Review Dg Chest 2 View  12/20/2013   CLINICAL DATA:  Chest pain, shortness of breath, wheezing  EXAM: CHEST  2 VIEW  COMPARISON:  Prior radiograph from below the/ 24/14  FINDINGS: The cardiac and mediastinal silhouettes are stable in size and contour, and remain within normal limits.  The lungs are normally inflated. No airspace consolidation, pleural effusion, or pulmonary edema is identified. There is no pneumothorax.  No acute osseous abnormality identified.  IMPRESSION: No active cardiopulmonary disease.   Electronically Signed   By: Rise MuBenjamin  McClintock M.D.   On: 12/20/2013 00:38     EKG Interpretation   Date/Time:  Saturday December 19 2013 23:46:47 EDT Ventricular Rate:  103 PR Interval:  159 QRS Duration: 80 QT Interval:  327 QTC Calculation: 428 R Axis:   92 Text Interpretation:  Sinus tachycardia RAE, consider biatrial enlargement  Borderline right axis deviation Borderline T wave abnormalities Borderline  ST elevation, anterior leads Early repolarization pattern No significant  change since last tracing Confirmed by Jabreel Chimento  MD, Brinton Brandel ([id number]) on  12/19/2013 11:53:21 PM      MDM   Final diagnoses:  Asthma exacerbation  Chest wall pain    28 year old male with asthma exacerbation mild.  No retractions, no respiratory distress.  Plan for neb treatment here, steroids, albuterol inhaler for home use.  [facility] of primary care.  1:03 AM Wheezing resolved.  Still some chest wall pain, will tx with  ultram.  Olivia Mackielga M Allyana Vogan, MD 12/20/13 [id number]

## 2013-12-20 NOTE — ED Notes (Signed)
Patient is alert and oriented x3.  He was given DC instructions and follow up visit instructions.  Patient gave verbal understanding.  He was DC ambulatory under his own power to home.  V/S stable.  He was not showing any signs of distress on DC 

## 2013-12-20 NOTE — Discharge Instructions (Signed)
Asthma, Adult °Asthma is a recurring condition in which the airways tighten and narrow. Asthma can make it difficult to breathe. It can cause coughing, wheezing, and shortness of breath. Asthma episodes (also called asthma attacks) range from minor to life-threatening. Asthma cannot be cured, but medicines and lifestyle changes can help control it. °CAUSES °Asthma is believed to be caused by inherited (genetic) and environmental factors, but its exact cause is unknown. Asthma may be triggered by allergens, lung infections, or irritants in the air. Asthma triggers are different for each person. Common triggers include:  °· Animal dander. °· Dust mites. °· Cockroaches. °· Pollen from trees or grass. °· Mold. °· Smoke. °· Air pollutants such as dust, household cleaners, hair sprays, aerosol sprays, paint fumes, strong chemicals, or strong odors. °· Cold air, weather changes, and winds (which increase molds and pollens in the air). °· Strong emotional expressions such as crying or laughing hard. °· Stress. °· Certain medicines (such as aspirin) or types of drugs (such as beta-blockers). °· Sulfites in foods and drinks. Foods and drinks that may contain sulfites include dried fruit, potato chips, and sparkling grape juice. °· Infections or inflammatory conditions such as the flu, a cold, or an inflammation of the nasal membranes (rhinitis). °· Gastroesophageal reflux disease (GERD). °· Exercise or strenuous activity. °SYMPTOMS °Symptoms may occur immediately after asthma is triggered or many hours later. Symptoms include: °· Wheezing. °· Excessive nighttime or early morning coughing. °· Frequent or severe coughing with a common cold. °· Chest tightness. °· Shortness of breath. °DIAGNOSIS  °The diagnosis of asthma is made by a review of your medical history and a physical exam. Tests may also be performed. These may include: °· Lung function studies. These tests show how much air you breath in and out. °· Allergy  tests. °· Imaging tests such as X-rays. °TREATMENT  °Asthma cannot be cured, but it can usually be controlled. Treatment involves identifying and avoiding your asthma triggers. It also involves medicines. There are 2 classes of medicine used for asthma treatment:  °· Controller medicines. These prevent asthma symptoms from occurring. They are usually taken every day. °· Reliever or rescue medicines. These quickly relieve asthma symptoms. They are used as needed and provide short-term relief. °Your health care provider will help you create an asthma action plan. An asthma action plan is a written plan for managing and treating your asthma attacks. It includes a list of your asthma triggers and how they may be avoided. It also includes information on when medicines should be taken and when their dosage should be changed. An action plan may also involve the use of a device called a peak flow meter. A peak flow meter measures how well the lungs are working. It helps you monitor your condition. °HOME CARE INSTRUCTIONS  °· Take medicine as directed by your health care provider. Speak with your health care provider if you have questions about how or when to take the medicines. °· Use a peak flow meter as directed by your health care provider. Record and keep track of readings. °· Understand and use the action plan to help minimize or stop an asthma attack without needing to seek medical care. °· Control your home environment in the following ways to help prevent asthma attacks: °· Do not smoke. Avoid being exposed to secondhand smoke. °· Change your heating and air conditioning filter regularly. °· Limit your use of fireplaces and wood stoves. °· Get rid of pests (such as roaches and   mice) and their droppings. °· Throw away plants if you see mold on them. °· Clean your floors and dust regularly. Use unscented cleaning products. °· Try to have someone else vacuum for you regularly. Stay out of rooms while they are being  vacuumed and for a short while afterward. If you vacuum, use a dust mask from a hardware store, a double-layered or microfilter vacuum cleaner bag, or a vacuum cleaner with a HEPA filter. °· Replace carpet with wood, tile, or vinyl flooring. Carpet can trap dander and dust. °· Use allergy-proof pillows, mattress covers, and box spring covers. °· Wash bed sheets and blankets every week in hot water and dry them in a dryer. °· Use blankets that are made of polyester or cotton. °· Clean bathrooms and kitchens with bleach. If possible, have someone repaint the walls in these rooms with mold-resistant paint. Keep out of the rooms that are being cleaned and painted. °· Wash hands frequently. °SEEK MEDICAL CARE IF:  °· You have wheezing, shortness of breath, or a cough even if taking medicine to prevent attacks. °· The colored mucus you cough up (sputum) is thicker than usual. °· Your sputum changes from clear or white to yellow, green, gray, or bloody. °· You have any problems that may be related to the medicines you are taking (such as a rash, itching, swelling, or trouble breathing). °· You are using a reliever medicine more than 2 3 times per week. °· Your peak flow is still at 50 79% of you personal best after following your action plan for 1 hour. °SEEK IMMEDIATE MEDICAL CARE IF:  °· You seem to be getting worse and are unresponsive to treatment during an asthma attack. °· You are short of breath even at rest. °· You get short of breath when doing very little physical activity. °· You have difficulty eating, drinking, or talking due to asthma symptoms. °· You develop chest pain. °· You develop a fast heartbeat. °· You have a bluish color to your lips or fingernails. °· You are lightheaded, dizzy, or faint. °· Your peak flow is less than 50% of your personal best. °· You have a fever or persistent symptoms for more than 2 3 days. °· You have a fever and symptoms suddenly get worse. °MAKE SURE YOU:  °· Understand these  instructions. °· Will watch your condition. °· Will get help right away if you are not doing well or get worse. °Document Released: 08/13/2005 Document Revised: 04/15/2013 Document Reviewed: 03/12/2013 °[employer]® Patient Information ©2014 [employer]. ° °Chest Wall Pain °Chest wall pain is pain in or around the bones and muscles of your chest. It may take up to 6 weeks to get better. It may take longer if you must stay physically active in your work and activities.  °CAUSES  °Chest wall pain may happen on its own. However, it may be caused by: °· A viral illness like the flu. °· Injury. °· Coughing. °· Exercise. °· Arthritis. °· Fibromyalgia. °· Shingles. °HOME CARE INSTRUCTIONS  °· Avoid overtiring physical activity. Try not to strain or perform activities that cause pain. This includes any activities using your chest or your abdominal and side muscles, especially if heavy weights are used. °· Put ice on the sore area. °· Put ice in a plastic bag. °· Place a towel between your skin and the bag. °· Leave the ice on for 15-20 minutes per hour while awake for the first 2 days. °· Only take over-the-counter or prescription   medicines for pain, discomfort, or fever as directed by your caregiver. °SEEK IMMEDIATE MEDICAL CARE IF:  °· Your pain increases, or you are very uncomfortable. °· You have a fever. °· Your chest pain becomes worse. °· You have new, unexplained symptoms. °· You have nausea or vomiting. °· You feel sweaty or lightheaded. °· You have a cough with phlegm (sputum), or you cough up blood. °MAKE SURE YOU:  °· Understand these instructions. °· Will watch your condition. °· Will get help right away if you are not doing well or get worse. °Document Released: 08/13/2005 Document Revised: 11/05/2011 Document Reviewed: 04/09/2011 °[employer]® Patient Information ©2014 [employer]. ° °

## 2014-06-22 ENCOUNTER — Emergency Department (HOSPITAL_COMMUNITY)
Admission: EM | Admit: 2014-06-22 | Discharge: 2014-06-22 | Disposition: A | Discharge status: Home / Self Care | Payer: No Typology Code available for payment source | Attending: Emergency Medicine | Admitting: Emergency Medicine

## 2014-06-22 ENCOUNTER — Encounter (HOSPITAL_COMMUNITY): Payer: Self-pay | Admitting: Emergency Medicine

## 2014-06-22 DIAGNOSIS — Z79899 Other long term (current) drug therapy: Secondary | ICD-10-CM | POA: Insufficient documentation

## 2014-06-22 DIAGNOSIS — J45909 Unspecified asthma, uncomplicated: Secondary | ICD-10-CM | POA: Insufficient documentation

## 2014-06-22 DIAGNOSIS — Z720 Tobacco use: Secondary | ICD-10-CM | POA: Insufficient documentation

## 2014-06-22 DIAGNOSIS — Z7952 Long term (current) use of systemic steroids: Secondary | ICD-10-CM | POA: Insufficient documentation

## 2014-06-22 DIAGNOSIS — Z8619 Personal history of other infectious and parasitic diseases: Secondary | ICD-10-CM | POA: Insufficient documentation

## 2014-06-22 DIAGNOSIS — K008 Other disorders of tooth development: Secondary | ICD-10-CM | POA: Insufficient documentation

## 2014-06-22 DIAGNOSIS — K088 Other specified disorders of teeth and supporting structures: Secondary | ICD-10-CM | POA: Insufficient documentation

## 2014-06-22 DIAGNOSIS — Z8679 Personal history of other diseases of the circulatory system: Secondary | ICD-10-CM | POA: Insufficient documentation

## 2014-06-22 DIAGNOSIS — K0889 Other specified disorders of teeth and supporting structures: Secondary | ICD-10-CM

## 2014-06-22 DIAGNOSIS — K029 Dental caries, unspecified: Secondary | ICD-10-CM | POA: Insufficient documentation

## 2014-06-22 MED ORDER — NAPROXEN 500 MG PO TABS
500.0000 mg | ORAL_TABLET | Freq: Once | ORAL | Status: AC
  Administered 2014-06-22: 500 mg via ORAL
  Filled 2014-06-22: qty 1

## 2014-06-22 MED ORDER — HYDROCODONE-ACETAMINOPHEN 5-325 MG PO TABS: ORAL_TABLET | ORAL | Status: DC

## 2014-06-22 MED ORDER — OXYCODONE-ACETAMINOPHEN 5-325 MG PO TABS
1.0000 | ORAL_TABLET | Freq: Once | ORAL | Status: AC
  Administered 2014-06-22: 1 via ORAL
  Filled 2014-06-22: qty 1

## 2014-06-22 MED ORDER — NAPROXEN 500 MG PO TABS: 500.0000 mg | ORAL_TABLET | Freq: Two times a day (BID) | ORAL | Status: DC

## 2014-06-22 NOTE — ED Notes (Signed)
Pt states that he has had a toothache for a month; pt reports taking his mother's Vicodin; pt states that the pain is to the rt side of his mouth; pt is unsure if its the top or the bottom

## 2014-06-22 NOTE — ED Notes (Signed)
patient ambulated to restroom without assistance.

## 2014-06-22 NOTE — Discharge Instructions (Signed)
Please read and follow all provided instructions.  Your diagnoses today include:  1. Pain, dental     The exam and treatment you received today has been provided on an emergency basis only. This is not a substitute for complete medical or dental care.  Tests performed today include:  Vital signs. See below for your results today.   Medications prescribed:   Vicodin (hydrocodone/acetaminophen) - narcotic pain medication  DO NOT drive or perform any activities that require you to be awake and alert because this medicine can make you drowsy. BE VERY CAREFUL not to take multiple medicines containing Tylenol (also called acetaminophen). Doing so can lead to an overdose which can damage your liver and cause liver failure and possibly death.   Naproxen - anti-inflammatory pain medication  Do not exceed 500mg  naproxen every 12 hours, take with food  You have been prescribed an anti-inflammatory medication or NSAID. Take with food. Take smallest effective dose for the shortest duration needed for your pain. Stop taking if you experience stomach pain or vomiting.   Take any prescribed medications only as directed.  Home care instructions:  Follow any educational materials contained in this packet.  Follow-up instructions: Please follow-up with your dentist for further evaluation of your symptoms.   Dental Assistance: See below for dental referrals  Return instructions:   Please return to the Emergency Department if you experience worsening symptoms.  Please return if you develop a fever, you develop more swelling in your face or neck, you have trouble breathing or swallowing food.  Please return if you have any other emergent concerns.  Additional Information:  Your vital signs today were: BP 134/75   Pulse 66   Temp(Src) 98.1 F (36.7 C) (Oral)   Resp 18   SpO2 98% If your blood pressure (BP) was elevated above 135/85 this visit, please have this repeated by your doctor within  one month. -------------- Dental Care: Organization         Address  Phone  Notes  [facility] [address], [place] [phone] Accepts children up to age 28 who are enrolled in [place] or [place] Health Choice; pregnant women with a Medicaid card; and children who have applied for Medicaid or [place] Health Choice, but were declined, whose parents can pay a reduced fee at time of service.  [facility] [place]  [address], [place] [phone] Accepts children up to age 28 who are enrolled in [place] or [place] Health Choice; pregnant women with a Medicaid card; and children who have applied for Medicaid or [place] Health Choice, but were declined, whose parents can pay a reduced fee at time of service.  [facility]  [address], [place] [phone] Patients are seen by appointment only. Walk-ins are not accepted. [facility] will see patients 28 years of age and older. Monday - Tuesday (8am-5pm) Most Wednesdays (8:30-5pm) $30 per visit, cash only  [facility]  [address], [facility] [place] [phone] Patients are seen by appointment only. Walk-ins are not accepted. [facility] will see patients 10118 years of age and older. One Wednesday Evening (Monthly: Volunteer Based).  $30 per visit, cash only  [facility]  [phone] for adults; Children under age 294, call Graduate Pediatric Dentistry at [phone]) [phone]. Children aged 604-14, please call [phone] to request a pediatric  application.  Dental services are provided in all areas of dental care including fillings, crowns and bridges, complete and partial dentures, implants, gum treatment, root canals, and extractions. Preventive care is also provided. Treatment is provided to both adults and children. Patients are selected via a lottery and there is  often a waiting list.   [facility] [address], [place]  [phone] [email or web address]   [facility] [address], [place], [place] [phone], Ext. [phone] Second and Fourth Thursday of each month, opens at 6:30 AM; Clinic ends at 9 AM.  Patients are seen on a first-come first-served basis, and a limited number are seen during each clinic.   [facility]  [address], [place], [place] [phone]   Eligibility Requirements You must have lived in [place], [place], or [place] for at least the last three months.   You cannot be eligible for state or federal sponsored [place]healthcare insurance, including [employer], [place]icaid, or [employer]icare.   You generally cannot be eligible for healthcare insurance through your employer.    How to apply: Eligibility screenings are held every Tuesday and Wednesday afternoon from 1:00 pm until 4:00 pm. You do not need an appointment for the interview!  [facility] [address], [place], [place] [phone]   [facility]  [phone]   [facility]  [phone]   [facility]  [phone]

## 2014-06-22 NOTE — ED Provider Notes (Signed)
CSN: [id number]     Arrival date & time 06/22/14  0231 History   First MD Initiated Contact with Patient 06/22/14 0250     Chief Complaint  Patient presents with  . Dental Pain     (Consider location/radiation/quality/duration/timing/severity/associated sxs/prior Treatment) HPI Comments: Patient presents with complaint of intermittent right-sided dental pain for the past 1 month. Patient has been taking a family member's Vicodin with some relief. He last took this 3 days ago. No treatments prior to arrival. Patient denies fever or swelling of the face or neck. No trouble swallowing. Onset of symptoms gradual.   The history is provided by the patient.    Past Medical History  Diagnosis Date  . 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 Asthma     daily use of inhaler  . Headache(784.0)     migraines   Past Surgical History  Procedure Laterality Date  . No past surgeries    . Wart fulguration  03/06/2012    Procedure: FULGURATION ANAL WART;  Surgeon: Velora Hecklerodd M Gerkin, MD;  Location: [facility];  Service: General;  Laterality: N/A;  . Wart fulguration N/A 10/30/2012    Procedure: Fulgeration  Anal condylomata, Rectal exam under anesthesia;  Surgeon: Velora Hecklerodd M Gerkin, MD;  Location: [facility];  Service: General;  Laterality: N/A;   No family history on file. History  Substance Use Topics  . Smoking status: Current Every Day Smoker -- 1 years    Types: Cigars  . Smokeless tobacco: Never Used     Comment: smokes Black and Milds - 1/day  . Alcohol Use: 0.5 oz/week    1 drink(s) per week     Comment: occasionally    Review of Systems  Constitutional: Negative for fever.  HENT: Positive for dental problem. Negative for ear pain, facial swelling, sore throat and trouble swallowing.   Respiratory: Negative for shortness of breath and stridor.   Musculoskeletal: Negative for neck pain.  Skin: Negative for color change.  Neurological: Negative for headaches.      Allergies   Review of patient's allergies indicates no known allergies.  Home Medications   Prior to Admission medications   Medication Sig Start Date End Date Taking? Authorizing Provider  albuterol (PROVENTIL HFA;VENTOLIN HFA) 108 (90 BASE) MCG/ACT inhaler Inhale 2 puffs into the lungs every 6 (six) hours as needed for wheezing (wheezing, sob).    Historical Provider, MD  albuterol (PROVENTIL HFA;VENTOLIN HFA) 108 (90 BASE) MCG/ACT inhaler Inhale 1-2 puffs into the lungs every 4 (four) hours as needed for wheezing or shortness of breath. 12/20/13   Olivia Mackielga M Otter, MD  predniSONE (DELTASONE) 20 MG tablet Take 3 tablets (60 mg total) by mouth daily. 12/20/13   Olivia Mackielga M Otter, MD  traMADol (ULTRAM) 50 MG tablet Take 1 tablet (50 mg total) by mouth every 6 (six) hours as needed for moderate pain. 12/20/13   Olivia Mackielga M Otter, MD   BP 134/75  Pulse 66  Temp(Src) 98.1 F (36.7 C) (Oral)  Resp 18  SpO2 98% Physical Exam  Nursing note and vitals reviewed. Constitutional: He appears well-developed and well-nourished.  HENT:  Head: Normocephalic and atraumatic.  Right Ear: Tympanic membrane, external ear and ear canal normal.  Left Ear: Tympanic membrane, external ear and ear canal normal.  Nose: Nose normal.  Mouth/Throat: Uvula is midline, oropharynx is clear and moist and mucous membranes are normal. No trismus in the jaw. Abnormal dentition. Dental caries present. No dental abscesses or uvula swelling.  No tonsillar abscesses.  Patient with R maxillar tooth pain and tenderness to palpation in area of third molar. There is a large carie noted in the third molar. No swelling or erythema noted on exam. No discrete abscess.  Eyes: Pupils are equal, round, and reactive to light.  Neck: Normal range of motion. Neck supple.  No neck swelling or Lugwig's angina  Neurological: He is alert.  Skin: Skin is warm and dry.  Psychiatric: He has a normal mood and affect.    ED Course  Procedures (including critical care  time) Labs Review Labs Reviewed - No data to display  Imaging Review No results found.   EKG Interpretation None      3:15 AM Patient seen and examined. Medications ordered.   Vital signs reviewed and are as follows: BP 134/75  Pulse 66  Temp(Src) 98.1 F (36.7 C) (Oral)  Resp 18  SpO2 98%  Patient counseled on use of narcotic pain medications. Counseled not to combine these medications with others containing tylenol. Urged not to drink alcohol, drive, or perform any other activities that requires focus while taking these medications. The patient verbalizes understanding and agrees with the plan.  Patient counseled to take prescribed medications as directed, return with worsening facial or neck swelling, and to follow-up with their dentist as soon as possible.    MDM   Final diagnoses:  Pain, dental   Patient with toothache. No fever. Exam unconcerning for Ludwig's angina or other deep tissue infection in neck.   As there is no facial swelling or gum findings, will not prescribe antibiotics at this time. Will treat with pain medication.      Renne CriglerJoshua Birgit Nowling, PA-C 06/22/14 [phone]

## 2014-06-22 NOTE — ED Provider Notes (Signed)
Medical screening examination/treatment/procedure(s) were performed by non-physician practitioner and as supervising physician I was immediately available for consultation/collaboration.   EKG Interpretation None        Frederick Marro L Amor Hyle, MD 06/22/14 0726 

## 2014-08-14 ENCOUNTER — Encounter (HOSPITAL_COMMUNITY): Payer: Self-pay

## 2014-08-14 ENCOUNTER — Emergency Department (HOSPITAL_COMMUNITY)
Admission: EM | Admit: 2014-08-14 | Discharge: 2014-08-14 | Disposition: A | Discharge status: Home / Self Care | Payer: No Typology Code available for payment source | Attending: Emergency Medicine | Admitting: Emergency Medicine

## 2014-08-14 ENCOUNTER — Emergency Department (HOSPITAL_COMMUNITY): Payer: No Typology Code available for payment source

## 2014-08-14 DIAGNOSIS — Z720 Tobacco use: Secondary | ICD-10-CM | POA: Diagnosis not present

## 2014-08-14 DIAGNOSIS — Z79899 Other long term (current) drug therapy: Secondary | ICD-10-CM | POA: Insufficient documentation

## 2014-08-14 DIAGNOSIS — G43909 Migraine, unspecified, not intractable, without status migrainosus: Secondary | ICD-10-CM | POA: Insufficient documentation

## 2014-08-14 DIAGNOSIS — R202 Paresthesia of skin: Secondary | ICD-10-CM

## 2014-08-14 DIAGNOSIS — Y998 Other external cause status: Secondary | ICD-10-CM | POA: Diagnosis not present

## 2014-08-14 DIAGNOSIS — J45909 Unspecified asthma, uncomplicated: Secondary | ICD-10-CM | POA: Insufficient documentation

## 2014-08-14 DIAGNOSIS — Y9389 Activity, other specified: Secondary | ICD-10-CM | POA: Insufficient documentation

## 2014-08-14 DIAGNOSIS — Z23 Encounter for immunization: Secondary | ICD-10-CM | POA: Insufficient documentation

## 2014-08-14 DIAGNOSIS — Y92410 Unspecified street and highway as the place of occurrence of the external cause: Secondary | ICD-10-CM | POA: Insufficient documentation

## 2014-08-14 DIAGNOSIS — M25511 Pain in right shoulder: Secondary | ICD-10-CM

## 2014-08-14 DIAGNOSIS — Z8619 Personal history of other infectious and parasitic diseases: Secondary | ICD-10-CM | POA: Insufficient documentation

## 2014-08-14 DIAGNOSIS — Z791 Long term (current) use of non-steroidal anti-inflammatories (NSAID): Secondary | ICD-10-CM | POA: Insufficient documentation

## 2014-08-14 DIAGNOSIS — S0083XA Contusion of other part of head, initial encounter: Secondary | ICD-10-CM

## 2014-08-14 DIAGNOSIS — S0990XA Unspecified injury of head, initial encounter: Secondary | ICD-10-CM | POA: Diagnosis present

## 2014-08-14 DIAGNOSIS — S4991XA Unspecified injury of right shoulder and upper arm, initial encounter: Secondary | ICD-10-CM | POA: Diagnosis not present

## 2014-08-14 DIAGNOSIS — S199XXA Unspecified injury of neck, initial encounter: Secondary | ICD-10-CM | POA: Insufficient documentation

## 2014-08-14 DIAGNOSIS — M542 Cervicalgia: Secondary | ICD-10-CM

## 2014-08-14 MED ORDER — DIAZEPAM 5 MG PO TABS: 5.0000 mg | ORAL_TABLET | Freq: Three times a day (TID) | ORAL | Status: DC | PRN

## 2014-08-14 MED ORDER — TETANUS-DIPHTH-ACELL PERTUSSIS 5-2.5-18.5 LF-MCG/0.5 IM SUSP
0.5000 mL | Freq: Once | INTRAMUSCULAR | Status: AC
  Administered 2014-08-14: 0.5 mL via INTRAMUSCULAR
  Filled 2014-08-14: qty 0.5

## 2014-08-14 MED ORDER — HYDROCODONE-ACETAMINOPHEN 5-325 MG PO TABS
1.0000 | ORAL_TABLET | Freq: Once | ORAL | Status: AC
  Administered 2014-08-14: 1 via ORAL
  Filled 2014-08-14: qty 1

## 2014-08-14 MED ORDER — HYDROCODONE-ACETAMINOPHEN 5-325 MG PO TABS: 1.0000 | ORAL_TABLET | ORAL | Status: DC | PRN
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
## 2014-08-14 NOTE — Discharge Instructions (Signed)
Read the information below.  Use the prescribed medication as directed.  Please discuss all new medications with your pharmacist.  Do not take additional tylenol while taking the prescribed pain medication to avoid overdose.  You may return to the Emergency Department at any time for worsening condition or any new symptoms that concern you.    If you develop fevers, loss of control of bowel or bladder, weakness or numbness in your legs, or are unable to walk, return to the ER for a recheck.  °
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
## 2014-09-01 ENCOUNTER — Emergency Department (HOSPITAL_COMMUNITY): Payer: No Typology Code available for payment source

## 2014-09-01 ENCOUNTER — Encounter (HOSPITAL_COMMUNITY): Payer: Self-pay

## 2014-09-01 ENCOUNTER — Observation Stay (HOSPITAL_COMMUNITY)
Admission: EM | Admit: 2014-09-01 | Discharge: 2014-09-02 | Disposition: A | Discharge status: Home / Self Care | Payer: No Typology Code available for payment source | Attending: Cardiovascular Disease | Admitting: Cardiovascular Disease

## 2014-09-01 DIAGNOSIS — R52 Pain, unspecified: Secondary | ICD-10-CM | POA: Diagnosis present

## 2014-09-01 DIAGNOSIS — J029 Acute pharyngitis, unspecified: Secondary | ICD-10-CM

## 2014-09-01 DIAGNOSIS — E43 Unspecified severe protein-calorie malnutrition: Secondary | ICD-10-CM | POA: Diagnosis not present

## 2014-09-01 DIAGNOSIS — R079 Chest pain, unspecified: Secondary | ICD-10-CM | POA: Diagnosis not present

## 2014-09-01 DIAGNOSIS — I213 ST elevation (STEMI) myocardial infarction of unspecified site: Secondary | ICD-10-CM

## 2014-09-01 DIAGNOSIS — Z79899 Other long term (current) drug therapy: Secondary | ICD-10-CM | POA: Diagnosis not present

## 2014-09-01 DIAGNOSIS — J069 Acute upper respiratory infection, unspecified: Secondary | ICD-10-CM

## 2014-09-01 DIAGNOSIS — R51 Headache: Secondary | ICD-10-CM | POA: Insufficient documentation

## 2014-09-01 DIAGNOSIS — F17210 Nicotine dependence, cigarettes, uncomplicated: Secondary | ICD-10-CM | POA: Insufficient documentation

## 2014-09-01 DIAGNOSIS — M549 Dorsalgia, unspecified: Secondary | ICD-10-CM | POA: Insufficient documentation

## 2014-09-01 DIAGNOSIS — J45909 Unspecified asthma, uncomplicated: Secondary | ICD-10-CM | POA: Insufficient documentation

## 2014-09-01 DIAGNOSIS — Z720 Tobacco use: Secondary | ICD-10-CM | POA: Diagnosis present

## 2014-09-01 DIAGNOSIS — E860 Dehydration: Secondary | ICD-10-CM | POA: Insufficient documentation

## 2014-09-01 DIAGNOSIS — R9431 Abnormal electrocardiogram [ECG] [EKG]: Secondary | ICD-10-CM | POA: Diagnosis present

## 2014-09-01 DIAGNOSIS — R072 Precordial pain: Secondary | ICD-10-CM | POA: Diagnosis present

## 2014-09-01 DIAGNOSIS — R55 Syncope and collapse: Secondary | ICD-10-CM | POA: Insufficient documentation

## 2014-09-01 DIAGNOSIS — M542 Cervicalgia: Secondary | ICD-10-CM | POA: Insufficient documentation

## 2014-09-01 DIAGNOSIS — M25511 Pain in right shoulder: Secondary | ICD-10-CM | POA: Insufficient documentation

## 2014-09-01 DIAGNOSIS — Z7982 Long term (current) use of aspirin: Secondary | ICD-10-CM | POA: Insufficient documentation

## 2014-09-01 DIAGNOSIS — S161XXA Strain of muscle, fascia and tendon at neck level, initial encounter: Secondary | ICD-10-CM

## 2014-09-01 DIAGNOSIS — R0602 Shortness of breath: Secondary | ICD-10-CM

## 2014-09-01 DIAGNOSIS — M79601 Pain in right arm: Secondary | ICD-10-CM

## 2014-09-01 LAB — RAPID URINE DRUG SCREEN, HOSP PERFORMED
Amphetamines: NOT DETECTED
Barbiturates: NOT DETECTED
Benzodiazepines: NOT DETECTED
COCAINE: NOT DETECTED
Opiates: NOT DETECTED
Tetrahydrocannabinol: NOT DETECTED

## 2014-09-01 LAB — I-STAT CHEM 8, ED
BUN: 15 mg/dL (ref 6–23)
CALCIUM ION: 1.15 mmol/L (ref 1.12–1.23)
Chloride: 105 mEq/L (ref 96–112)
Creatinine, Ser: 1.2 mg/dL (ref 0.50–1.35)
GLUCOSE: 88 mg/dL (ref 70–99)
HEMATOCRIT: 49 % (ref 39.0–52.0)
Hemoglobin: 16.7 g/dL (ref 13.0–17.0)
POTASSIUM: 4 mmol/L (ref 3.5–5.1)
Sodium: 140 mmol/L (ref 135–145)
TCO2: 24 mmol/L (ref 0–100)

## 2014-09-01 LAB — CBC WITH DIFFERENTIAL/PLATELET
Basophils Absolute: 0 10*3/uL (ref 0.0–0.1)
Basophils Relative: 1 % (ref 0–1)
Eosinophils Absolute: 0.4 10*3/uL (ref 0.0–0.7)
Eosinophils Relative: 8 % — ABNORMAL HIGH (ref 0–5)
HCT: 46.4 % (ref 39.0–52.0)
Hemoglobin: 15.1 g/dL (ref 13.0–17.0)
LYMPHS ABS: 2.2 10*3/uL (ref 0.7–4.0)
Lymphocytes Relative: 48 % — ABNORMAL HIGH (ref 12–46)
MCH: 27.6 pg (ref 26.0–34.0)
MCHC: 32.5 g/dL (ref 30.0–36.0)
MCV: 84.7 fL (ref 78.0–100.0)
MONOS PCT: 9 % (ref 3–12)
Monocytes Absolute: 0.4 10*3/uL (ref 0.1–1.0)
NEUTROS ABS: 1.5 10*3/uL — AB (ref 1.7–7.7)
Neutrophils Relative %: 34 % — ABNORMAL LOW (ref 43–77)
PLATELETS: 246 10*3/uL (ref 150–400)
RBC: 5.48 MIL/uL (ref 4.22–5.81)
RDW: 12.5 % (ref 11.5–15.5)
WBC: 4.6 10*3/uL (ref 4.0–10.5)

## 2014-09-01 LAB — URINALYSIS, ROUTINE W REFLEX MICROSCOPIC
BILIRUBIN URINE: NEGATIVE
Glucose, UA: NEGATIVE mg/dL
Hgb urine dipstick: NEGATIVE
Ketones, ur: NEGATIVE mg/dL
Leukocytes, UA: NEGATIVE
Nitrite: NEGATIVE
Protein, ur: NEGATIVE mg/dL
SPECIFIC GRAVITY, URINE: 1.026 (ref 1.005–1.030)
Urobilinogen, UA: 1 mg/dL (ref 0.0–1.0)
pH: 6.5 (ref 5.0–8.0)

## 2014-09-01 LAB — I-STAT TROPONIN, ED: TROPONIN I, POC: 0 ng/mL (ref 0.00–0.08)

## 2014-09-01 MED ORDER — ASPIRIN 81 MG PO CHEW
324.0000 mg | CHEWABLE_TABLET | Freq: Once | ORAL | Status: AC
  Administered 2014-09-01: 324 mg via ORAL
  Filled 2014-09-01: qty 4

## 2014-09-01 MED ORDER — HEPARIN SODIUM (PORCINE) 5000 UNIT/ML IJ SOLN: 4000.0000 [IU] | Freq: Once | INTRAMUSCULAR | Status: DC

## 2014-09-01 MED ORDER — HEPARIN BOLUS VIA INFUSION
4000.0000 [IU] | Freq: Once | INTRAVENOUS | Status: AC
  Administered 2014-09-01: 4000 [IU] via INTRAVENOUS
  Filled 2014-09-01: qty 4000

## 2014-09-01 MED ORDER — HEPARIN (PORCINE) IN NACL 100-0.45 UNIT/ML-% IJ SOLN
1000.0000 [IU]/h | INTRAMUSCULAR | Status: DC
  Administered 2014-09-01: 1000 [IU]/h via INTRAVENOUS
  Filled 2014-09-01: qty 250
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
## 2014-09-01 NOTE — Progress Notes (Signed)
ANTICOAGULATION CONSULT NOTE - Initial Consult  Pharmacy Consult for Heparin Indication: chest pain/ACS  No Known Allergies  Patient Measurements: Height: 6\' 2"  (188 cm) Weight: 183 lb (83.008 kg) IBW/kg (Calculated) : 82.2 Heparin Dosing Weight:   Vital Signs: Temp: 98.3 F (36.8 C) (01/06 2214) Temp Source: Oral (01/06 2214) BP: 127/73 mmHg (01/06 2214) Pulse Rate: 87 (01/06 2214)  Labs:  Recent Labs  09/01/14 2330 09/01/14 2336  HGB 15.1 16.7  HCT 46.4 49.0  PLT 246  --   CREATININE  --  1.20    Estimated Creatinine Clearance: 106.6 mL/min (by C-G formula based on Cr of 1.2).   Medical History: Past Medical History  Diagnosis Date  . 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 Asthma     daily use of inhaler  . Headache(784.0)     migraines    Medications:  Infusions:  . heparin 1,000 Units/hr (09/01/14 2351)    Assessment: Patient with chest pain x 3 days and syncopal episode.  ST elevations on ECG.  Heparin per pharmacy ordered for STEMI and [facility]. Baseline labs ordered but not resulted.  Goal of Therapy:  Heparin level 0.3-0.7 units/ml Monitor platelets by anticoagulation protocol: Yes   Plan:  Heparin 4000 units iv x1 Heparin drip at 1000 units/hr Daily CBC Heparin level at 0800--[facility] will need to reorder if drip continues. Call [facility] to alert for patient and hand-off.  Aleene DavidsonGrimsley Jr, Zayna Toste Crowford 09/01/2014,11:53 PM

## 2014-09-02 ENCOUNTER — Observation Stay (HOSPITAL_COMMUNITY): Payer: No Typology Code available for payment source

## 2014-09-02 ENCOUNTER — Encounter (HOSPITAL_COMMUNITY)
Admission: EM | Disposition: A | Discharge status: Home / Self Care | Payer: Self-pay | Source: Home / Self Care | Attending: Cardiovascular Disease

## 2014-09-02 ENCOUNTER — Other Ambulatory Visit: Payer: Self-pay | Admitting: Cardiology

## 2014-09-02 ENCOUNTER — Encounter (HOSPITAL_COMMUNITY): Payer: Self-pay | Admitting: Internal Medicine

## 2014-09-02 DIAGNOSIS — R079 Chest pain, unspecified: Secondary | ICD-10-CM

## 2014-09-02 DIAGNOSIS — E43 Unspecified severe protein-calorie malnutrition: Secondary | ICD-10-CM | POA: Diagnosis present

## 2014-09-02 DIAGNOSIS — I059 Rheumatic mitral valve disease, unspecified: Secondary | ICD-10-CM

## 2014-09-02 DIAGNOSIS — R55 Syncope and collapse: Secondary | ICD-10-CM | POA: Diagnosis present

## 2014-09-02 DIAGNOSIS — R52 Pain, unspecified: Secondary | ICD-10-CM | POA: Diagnosis present

## 2014-09-02 DIAGNOSIS — Z720 Tobacco use: Secondary | ICD-10-CM | POA: Diagnosis present

## 2014-09-02 DIAGNOSIS — R9431 Abnormal electrocardiogram [ECG] [EKG]: Secondary | ICD-10-CM | POA: Diagnosis present

## 2014-09-02 DIAGNOSIS — J45909 Unspecified asthma, uncomplicated: Secondary | ICD-10-CM | POA: Diagnosis present

## 2014-09-02 DIAGNOSIS — R072 Precordial pain: Secondary | ICD-10-CM | POA: Diagnosis present

## 2014-09-02 HISTORY — PX: LEFT HEART CATHETERIZATION WITH CORONARY ANGIOGRAM: SHX5451

## 2014-09-02 LAB — LIPID PANEL
Cholesterol: 167 mg/dL (ref 0–200)
HDL: 43 mg/dL (ref >39)
LDL CALC: 86 mg/dL (ref 0–99)
TRIGLYCERIDES: 188 mg/dL — AB (ref <150)
Total CHOL/HDL Ratio: 3.9 RATIO
VLDL: 38 mg/dL (ref 0–40)

## 2014-09-02 LAB — HEMOGLOBIN A1C
HEMOGLOBIN A1C: 5.3 % (ref <5.7)
MEAN PLASMA GLUCOSE: 105 mg/dL (ref <117)

## 2014-09-02 LAB — TROPONIN I: Troponin I: <0.03 ng/mL (ref <0.031)

## 2014-09-02 LAB — PROTIME-INR
INR: 0.95 (ref 0.00–1.49)
PROTHROMBIN TIME: 12.8 s (ref 11.6–15.2)

## 2014-09-02 LAB — MRSA PCR SCREENING: MRSA BY PCR: NEGATIVE

## 2014-09-02 LAB — BRAIN NATRIURETIC PEPTIDE: B Natriuretic Peptide: 5.7 pg/mL (ref 0.0–100.0)

## 2014-09-02 LAB — APTT: APTT: 37 s (ref 24–37)

## 2014-09-02 LAB — RAPID STREP SCREEN (MED CTR MEBANE ONLY): Streptococcus, Group A Screen (Direct): NEGATIVE

## 2014-09-02 LAB — TSH: TSH: 3.92 u[IU]/mL (ref 0.350–4.500)

## 2014-09-02 SURGERY — LEFT HEART CATHETERIZATION WITH CORONARY ANGIOGRAM
Anesthesia: LOCAL

## 2014-09-02 MED ORDER — INFLUENZA VAC SPLIT QUAD 0.5 ML IM SUSY: 0.5000 mL | PREFILLED_SYRINGE | INTRAMUSCULAR | Status: DC

## 2014-09-02 MED ORDER — HEPARIN (PORCINE) IN NACL 2-0.9 UNIT/ML-% IJ SOLN
INTRAMUSCULAR | Status: AC
  Filled 2014-09-02: qty 1000

## 2014-09-02 MED ORDER — IBUPROFEN 400 MG PO TABS: 400.0000 mg | ORAL_TABLET | Freq: Two times a day (BID) | ORAL | Status: DC

## 2014-09-02 MED ORDER — ONDANSETRON HCL 4 MG/2ML IJ SOLN: 4.0000 mg | Freq: Four times a day (QID) | INTRAMUSCULAR | Status: DC | PRN

## 2014-09-02 MED ORDER — OXYCODONE-ACETAMINOPHEN 5-325 MG PO TABS
1.0000 | ORAL_TABLET | ORAL | Status: AC | PRN
  Administered 2014-09-02 (×3): 2 via ORAL
  Filled 2014-09-02 (×3): qty 2

## 2014-09-02 MED ORDER — PNEUMOCOCCAL VAC POLYVALENT 25 MCG/0.5ML IJ INJ: 0.5000 mL | INJECTION | INTRAMUSCULAR | Status: DC

## 2014-09-02 MED ORDER — IBUPROFEN 800 MG PO TABS
800.0000 mg | ORAL_TABLET | Freq: Three times a day (TID) | ORAL | Status: DC
  Filled 2014-09-02 (×3): qty 1

## 2014-09-02 MED ORDER — LIDOCAINE HCL (PF) 1 % IJ SOLN
INTRAMUSCULAR | Status: AC
  Filled 2014-09-02: qty 30

## 2014-09-02 MED ORDER — HEPARIN (PORCINE) IN NACL 2-0.9 UNIT/ML-% IJ SOLN
INTRAMUSCULAR | Status: AC
  Filled 2014-09-02: qty 500

## 2014-09-02 MED ORDER — ALBUTEROL SULFATE (2.5 MG/3ML) 0.083% IN NEBU: 3.0000 mL | INHALATION_SOLUTION | Freq: Four times a day (QID) | RESPIRATORY_TRACT | Status: DC | PRN

## 2014-09-02 MED ORDER — BOOST / RESOURCE BREEZE PO LIQD: 1.0000 | Freq: Three times a day (TID) | ORAL | Status: DC

## 2014-09-02 MED ORDER — IBUPROFEN 800 MG PO TABS: 800.0000 mg | ORAL_TABLET | Freq: Three times a day (TID) | ORAL | Status: DC

## 2014-09-02 MED ORDER — ACETAMINOPHEN 325 MG PO TABS: 650.0000 mg | ORAL_TABLET | ORAL | Status: DC | PRN

## 2014-09-02 MED ORDER — IBUPROFEN 800 MG PO TABS: 800.0000 mg | ORAL_TABLET | Freq: Two times a day (BID) | ORAL | Status: DC

## 2014-09-02 MED ORDER — ACETAMINOPHEN 325 MG PO TABS: 650.0000 mg | ORAL_TABLET | Freq: Four times a day (QID) | ORAL | Status: DC | PRN
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
## 2014-09-02 NOTE — Progress Notes (Signed)
Chaplain responded to Code Stemi of 28 year alert male [facility]. Upon arrival to the hospital he was scheduled to go to the Cath Lab but was taken instead to 2H Rm 25 where he underwent medical testing.  On follow-up his family was present and patient was alert and in good spirits. Offered words of encouragement and comfort measures.  Family was appreciative of support and follow up by Unit Chaplain would be appreciated.

## 2014-09-02 NOTE — Progress Notes (Signed)
UR completed 
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
## 2014-09-02 NOTE — Progress Notes (Signed)
  Echocardiogram 2D Echocardiogram has been performed.  Janalyn HarderWest, Bona Hubbard R 09/02/2014, 9:05 AM

## 2014-09-02 NOTE — Discharge Instructions (Signed)
Regular diet   Drink resource drink three times a day after meals- you are malnourished so very important to increase your calorie intake.  Important to have food on your stomach when you take the Ibuprofen.  Our office will call you to schedule an exercise treadmill in our office.  They will also give you an appointment for follow up in Dr. Elissa HeftyHarding's  Office.
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
## 2014-09-03 LAB — CULTURE, GROUP A STREP

## 2014-09-09 ENCOUNTER — Encounter (HOSPITAL_BASED_OUTPATIENT_CLINIC_OR_DEPARTMENT_OTHER): Payer: Self-pay | Admitting: Surgery

## 2014-09-10 ENCOUNTER — Encounter (HOSPITAL_COMMUNITY): Payer: Self-pay | Admitting: *Deleted

## 2014-09-23 ENCOUNTER — Telehealth (HOSPITAL_COMMUNITY): Payer: Self-pay

## 2014-09-23 NOTE — Telephone Encounter (Signed)
Encounter complete. 

## 2014-09-28 ENCOUNTER — Ambulatory Visit (HOSPITAL_COMMUNITY)
Admission: RE | Admit: 2014-09-28 | Discharge: 2014-09-28 | Disposition: A | Discharge status: Home / Self Care | Payer: Self-pay | Source: Ambulatory Visit | Attending: Cardiology | Admitting: Cardiology

## 2014-09-28 DIAGNOSIS — R079 Chest pain, unspecified: Secondary | ICD-10-CM | POA: Insufficient documentation

## 2014-09-28 NOTE — Procedures (Signed)
Exercise Treadmill Test    Test  Exercise Tolerance Test Ordering MD: Marykay Lexavid W. Harding, MD    Unique Test No: 1  Treadmill:  1  Indication for ETT: chest pain - rule out ischemia  Contraindication to ETT: No   Stress Modality: exercise - treadmill  Cardiac Imaging Performed: non   Protocol: standard Bruce - maximal  Max BP:  172/79  Max MPHR (bpm):  192 85% MPR (bpm):  163  MPHR obtained (bpm):  173 % MPHR obtained:  90  Reached 85% MPHR (min:sec):  7:40 Total Exercise Time (min-sec):  9  Workload in METS:  10.1 Borg Scale: 15  Reason ETT Terminated:  Fatigue and Patient's request    ST Segment Analysis At Rest: normal ST segments - no evidence of significant ST depression With Exercise: no evidence of significant ST depression  Other Information Arrhythmia:  No Angina during ETT:  absent (0) Quality of ETT:  diagnostic  ETT Interpretation:  normal - no evidence of ischemia by ST analysis  Comments: The patient had an good exercise tolerance.  There was no chest pain.  There was an appropriate level of dyspnea.  There were no arrhythmias, a normal heart rate response and normal BP response.  There were no ischemic ST T wave changes and a normal heart rate recovery.   Recommendations: Negative adequate ETT.   The patient did get dizzy but reported that he hadn't eaten all day. This was apparently how he felt when he had syncope. However, he had no syncope or significant abnormalities with this test.

## 2014-09-30 ENCOUNTER — Encounter: Payer: Self-pay | Admitting: *Deleted

## 2014-12-19 ENCOUNTER — Emergency Department (HOSPITAL_COMMUNITY)
Admission: EM | Admit: 2014-12-19 | Discharge: 2014-12-20 | Disposition: A | Discharge status: Home / Self Care | Payer: Self-pay | Attending: Emergency Medicine | Admitting: Emergency Medicine

## 2014-12-19 ENCOUNTER — Encounter (HOSPITAL_COMMUNITY): Payer: Self-pay | Admitting: Emergency Medicine

## 2014-12-19 DIAGNOSIS — M549 Dorsalgia, unspecified: Secondary | ICD-10-CM | POA: Insufficient documentation

## 2014-12-19 DIAGNOSIS — J40 Bronchitis, not specified as acute or chronic: Secondary | ICD-10-CM

## 2014-12-19 DIAGNOSIS — J45901 Unspecified asthma with (acute) exacerbation: Secondary | ICD-10-CM | POA: Insufficient documentation

## 2014-12-19 DIAGNOSIS — Z79899 Other long term (current) drug therapy: Secondary | ICD-10-CM | POA: Insufficient documentation

## 2014-12-19 DIAGNOSIS — Z8619 Personal history of other infectious and parasitic diseases: Secondary | ICD-10-CM | POA: Insufficient documentation

## 2014-12-19 DIAGNOSIS — M542 Cervicalgia: Secondary | ICD-10-CM | POA: Insufficient documentation

## 2014-12-19 DIAGNOSIS — Z791 Long term (current) use of non-steroidal anti-inflammatories (NSAID): Secondary | ICD-10-CM | POA: Insufficient documentation

## 2014-12-19 DIAGNOSIS — Z8679 Personal history of other diseases of the circulatory system: Secondary | ICD-10-CM | POA: Insufficient documentation

## 2014-12-19 DIAGNOSIS — Z720 Tobacco use: Secondary | ICD-10-CM | POA: Insufficient documentation

## 2014-12-19 NOTE — ED Notes (Addendum)
Patient reports cough starting last week causing chest tightness. Reports "beige and yellow" sputum. Says he "passed out" around 1300 today. Has had fevers but is unsure Tmax. Ambulatory. Sounds audibly congested. Has tried Claritin, Benadryl, and Excedrin without alleviation of symptoms. Denies N/V/D. Utilized albuterol inhaler today. Hx asthma.

## 2014-12-20 ENCOUNTER — Emergency Department (HOSPITAL_COMMUNITY): Payer: Self-pay

## 2014-12-20 MED ORDER — IPRATROPIUM BROMIDE 0.02 % IN SOLN
0.5000 mg | Freq: Once | RESPIRATORY_TRACT | Status: AC
  Administered 2014-12-20: 0.5 mg via RESPIRATORY_TRACT
  Filled 2014-12-20: qty 2.5

## 2014-12-20 MED ORDER — ALBUTEROL SULFATE (2.5 MG/3ML) 0.083% IN NEBU
2.5000 mg | INHALATION_SOLUTION | Freq: Once | RESPIRATORY_TRACT | Status: DC
  Filled 2014-12-20: qty 3

## 2014-12-20 MED ORDER — ALBUTEROL SULFATE (2.5 MG/3ML) 0.083% IN NEBU
INHALATION_SOLUTION | RESPIRATORY_TRACT | Status: AC
  Filled 2014-12-20: qty 6

## 2014-12-20 MED ORDER — PREDNISONE 20 MG PO TABS: 60.0000 mg | ORAL_TABLET | Freq: Every day | ORAL | Status: DC

## 2014-12-20 MED ORDER — AZITHROMYCIN 250 MG PO TABS
500.0000 mg | ORAL_TABLET | Freq: Once | ORAL | Status: AC
  Administered 2014-12-20: 500 mg via ORAL
  Filled 2014-12-20: qty 2

## 2014-12-20 MED ORDER — ALBUTEROL SULFATE (2.5 MG/3ML) 0.083% IN NEBU
5.0000 mg | INHALATION_SOLUTION | Freq: Once | RESPIRATORY_TRACT | Status: AC
  Administered 2014-12-20: 5 mg via RESPIRATORY_TRACT
  Filled 2014-12-20: qty 6

## 2014-12-20 MED ORDER — AZITHROMYCIN 250 MG PO TABS: 250.0000 mg | ORAL_TABLET | Freq: Every day | ORAL | Status: DC

## 2014-12-20 MED ORDER — ALBUTEROL SULFATE HFA 108 (90 BASE) MCG/ACT IN AERS
2.0000 | INHALATION_SPRAY | RESPIRATORY_TRACT | Status: DC | PRN
  Administered 2014-12-20: 2 via RESPIRATORY_TRACT
  Filled 2014-12-20: qty 6.7

## 2014-12-20 MED ORDER — HYDROCOD POLST-CPM POLST ER 10-8 MG/5ML PO SUER
5.0000 mL | Freq: Once | ORAL | Status: AC
  Administered 2014-12-20: 5 mL via ORAL
  Filled 2014-12-20: qty 5

## 2014-12-20 MED ORDER — ALBUTEROL SULFATE (2.5 MG/3ML) 0.083% IN NEBU
5.0000 mg | INHALATION_SOLUTION | Freq: Once | RESPIRATORY_TRACT | Status: AC
  Administered 2014-12-20: 5 mg via RESPIRATORY_TRACT

## 2014-12-20 MED ORDER — PREDNISONE 20 MG PO TABS
60.0000 mg | ORAL_TABLET | Freq: Once | ORAL | Status: AC
  Administered 2014-12-20: 60 mg via ORAL
  Filled 2014-12-20: qty 3

## 2014-12-20 MED ORDER — HYDROCOD POLST-CPM POLST ER 10-8 MG/5ML PO SUER: 5.0000 mL | Freq: Two times a day (BID) | ORAL | Status: DC | PRN

## 2014-12-20 NOTE — Discharge Instructions (Signed)

## 2014-12-20 NOTE — ED Provider Notes (Signed)
CSN: [id number]     Arrival date & time 12/19/14  2220 History   First MD Initiated Contact with Patient 12/20/14 0111     Chief Complaint  Patient presents with  . Asthma     (Consider location/radiation/quality/duration/timing/severity/associated sxs/prior Treatment) HPI  Clarence Simpson is a 29 y.o. presenting for complaint of productive cough, chest pain w/ breathing, shortness of breath, fever, and neck and back pain. Pt. reports cough x 1 week with worsening symptoms. Sputum beige and yellow colored. Asthmatic with hospitalization history. Used albuterol today with no improvement. Pt. also reports dizziness and having a passing out episode earlier today. Pt reports history of syncopal episodes. Current every day cigar smoker. No unexpected weight loss, nausea, vomiting, or diarrhea.  Past Medical History  Diagnosis Date  . 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 Asthma     daily use of inhaler  . Headache(784.0)     migraines   Past Surgical History  Procedure Laterality Date  . No past surgeries    . Wart fulguration N/A 10/30/2012    Procedure: Fulgeration  Anal condylomata, Rectal exam under anesthesia;  Surgeon: Velora Heckler, MD;  Location: [facility];  Service: General;  Laterality: N/A;  . Left heart catheterization with coronary angiogram N/A 09/02/2014    Procedure: LEFT HEART CATHETERIZATION WITH CORONARY ANGIOGRAM;  Surgeon: Kathleene Hazel, MD;  Location: [facility] CATH LAB;  Service: Cardiovascular;  Laterality: N/A;   History reviewed. No pertinent family history. History  Substance Use Topics  . Smoking status: Current Every Day Smoker -- 1 years    Types: Cigars  . Smokeless tobacco: Never Used     Comment: smokes Black and Milds - 1/day  . Alcohol Use: 0.5 oz/week    1 drink(s) per week     Comment: occasionally    Review of Systems  Constitutional: Positive for fever, chills, diaphoresis, activity change, appetite change and fatigue. Negative for unexpected  weight change.  HENT: Positive for congestion and sore throat. Negative for sinus pressure and trouble swallowing.   Eyes: Negative for visual disturbance.  Respiratory: Positive for cough, shortness of breath and wheezing. Negative for choking and chest tightness.   Cardiovascular: Negative for chest pain and palpitations.  Gastrointestinal: Negative for nausea, vomiting, abdominal pain, diarrhea and abdominal distention.  Musculoskeletal: Positive for myalgias, back pain and neck pain.  Skin: Negative for color change, pallor and rash.  Neurological: Positive for dizziness, syncope, weakness, light-headedness and headaches. Negative for numbness.  Hematological: Negative for adenopathy.      Allergies  Review of patient's allergies indicates no known allergies.  Home Medications   Prior to Admission medications   Medication Sig Start Date End Date Taking? Authorizing Provider  albuterol (PROVENTIL HFA;VENTOLIN HFA) 108 (90 BASE) MCG/ACT inhaler Inhale 2 puffs into the lungs every 6 (six) hours as needed for wheezing (wheezing, sob).   Yes Historical Provider, MD  aspirin-acetaminophen-caffeine (EXCEDRIN MIGRAINE) [phone] MG per tablet Take 4 tablets by mouth every 6 (six) hours as needed for headache.   Yes Historical Provider, MD  diphenhydrAMINE (BENADRYL) 25 MG tablet Take 75 mg by mouth every 6 (six) hours as needed for itching or allergies.   Yes Historical Provider, MD  loratadine (CLARITIN) 10 MG tablet Take 10 mg by mouth daily.   Yes Historical Provider, MD  diazepam (VALIUM) 5 MG tablet Take 1 tablet (5 mg total) by mouth every 8 (eight) hours as needed for muscle spasms (or pain). Patient not taking:  Reported on 12/20/2014 08/14/14   Trixie DredgeEmily West, PA-C  feeding supplement, RESOURCE BREEZE, (RESOURCE BREEZE) LIQD Take 1 Container by mouth 3 (three) times daily between meals. Patient not taking: Reported on 12/20/2014 09/02/14   Leone BrandLaura R Ingold, NP  HYDROcodone-acetaminophen  (NORCO/VICODIN) 5-325 MG per tablet Take 1 tablet by mouth every 4 (four) hours as needed for moderate pain or severe pain. Patient not taking: Reported on 09/02/2014 08/14/14   Trixie DredgeEmily West, PA-C  ibuprofen (ADVIL,MOTRIN) 400 MG tablet Take 1 tablet (400 mg total) by mouth 2 (two) times daily. Patient not taking: Reported on 12/20/2014 09/08/14   Leone BrandLaura R Ingold, NP  ibuprofen (ADVIL,MOTRIN) 800 MG tablet Take 1 tablet (800 mg total) by mouth 3 (three) times daily with meals. Patient not taking: Reported on 12/20/2014 09/02/14   Leone BrandLaura R Ingold, NP  ibuprofen (ADVIL,MOTRIN) 800 MG tablet Take 1 tablet (800 mg total) by mouth 2 (two) times daily. Patient not taking: Reported on 12/20/2014 09/05/14   Leone BrandLaura R Ingold, NP   BP 125/75 mmHg  Pulse 94  Temp(Src) 99.5 F (37.5 C) (Oral)  Resp 20  Ht 6\' 1"  (1.854 m)  Wt 180 lb (81.647 kg)  BMI 23.75 kg/m2  SpO2 98% Physical Exam  Constitutional: He is oriented to person, place, and time. He appears well-developed. He appears distressed.  HENT:  Head: Normocephalic and atraumatic.  Mouth/Throat: Oropharynx is clear and moist.  Eyes: Conjunctivae are normal. Pupils are equal, round, and reactive to light.  Neck: Normal range of motion. Neck supple.  Cardiovascular: Normal rate, regular rhythm and normal heart sounds.   Pulmonary/Chest: He is in respiratory distress. He has wheezes. He has no rales. He exhibits tenderness.  Wheezing b/l  Abdominal: Soft. Bowel sounds are normal. He exhibits no distension. There is no tenderness.  Lymphadenopathy:    He has no cervical adenopathy.  Neurological: He is alert and oriented to person, place, and time.  Skin: Skin is warm. No rash noted. He is diaphoretic.  Psychiatric: He has a normal mood and affect. His behavior is normal.    ED Course  Procedures (including critical care time) Labs Review Labs Reviewed - No data to display  Imaging Review No results found.   EKG Interpretation None      MDM    Final diagnoses:  None    1. Bronchitis  The patient is clinically well appearing. He is very interested in pain medication for his chest pain associated with cough. No known fever but reports worsening symptoms over the last one week, initially thought to be related to allergies. Will treat with antibiotics, prednisone, inhaler and PCP referral for follow up outside the emergency department.     Elpidio AnisShari Shalisha Clausing, PA-C 12/20/14 [phone]  Marisa Severinlga Otter, MD 12/20/14 [phone]

## 2015-11-07 ENCOUNTER — Encounter (HOSPITAL_COMMUNITY): Payer: Self-pay | Admitting: *Deleted

## 2015-11-07 ENCOUNTER — Emergency Department (HOSPITAL_COMMUNITY): Payer: Self-pay

## 2015-11-07 ENCOUNTER — Emergency Department (HOSPITAL_COMMUNITY)
Admission: EM | Admit: 2015-11-07 | Discharge: 2015-11-07 | Discharge status: Absent without leave | Payer: No Typology Code available for payment source

## 2015-11-07 ENCOUNTER — Emergency Department (HOSPITAL_COMMUNITY)
Admission: EM | Admit: 2015-11-07 | Discharge: 2015-11-07 | Disposition: A | Discharge status: Home / Self Care | Payer: Self-pay | Attending: Emergency Medicine | Admitting: Emergency Medicine

## 2015-11-07 DIAGNOSIS — F17210 Nicotine dependence, cigarettes, uncomplicated: Secondary | ICD-10-CM | POA: Insufficient documentation

## 2015-11-07 DIAGNOSIS — R509 Fever, unspecified: Secondary | ICD-10-CM | POA: Insufficient documentation

## 2015-11-07 DIAGNOSIS — J45909 Unspecified asthma, uncomplicated: Secondary | ICD-10-CM | POA: Insufficient documentation

## 2015-11-07 DIAGNOSIS — R079 Chest pain, unspecified: Secondary | ICD-10-CM | POA: Insufficient documentation

## 2015-11-07 DIAGNOSIS — R55 Syncope and collapse: Secondary | ICD-10-CM | POA: Insufficient documentation

## 2015-11-07 DIAGNOSIS — R05 Cough: Secondary | ICD-10-CM | POA: Insufficient documentation

## 2015-11-07 LAB — CBC
HEMATOCRIT: 46 % (ref 39.0–52.0)
HEMOGLOBIN: 15.2 g/dL (ref 13.0–17.0)
MCH: 27.7 pg (ref 26.0–34.0)
MCHC: 33 g/dL (ref 30.0–36.0)
MCV: 83.9 fL (ref 78.0–100.0)
Platelets: 174 10*3/uL (ref 150–400)
RBC: 5.48 MIL/uL (ref 4.22–5.81)
RDW: 12.4 % (ref 11.5–15.5)
WBC: 3 10*3/uL — ABNORMAL LOW (ref 4.0–10.5)

## 2015-11-07 LAB — BASIC METABOLIC PANEL
ANION GAP: 8 (ref 5–15)
BUN: 7 mg/dL (ref 6–20)
CALCIUM: 8.5 mg/dL — AB (ref 8.9–10.3)
CO2: 27 mmol/L (ref 22–32)
Chloride: 103 mmol/L (ref 101–111)
Creatinine, Ser: 1.12 mg/dL (ref 0.61–1.24)
GFR calc non Af Amer: >60 mL/min (ref >60)
GLUCOSE: 87 mg/dL (ref 65–99)
POTASSIUM: 3.7 mmol/L (ref 3.5–5.1)
Sodium: 138 mmol/L (ref 135–145)

## 2015-11-07 LAB — I-STAT TROPONIN, ED: TROPONIN I, POC: 0 ng/mL (ref 0.00–0.08)

## 2015-11-07 NOTE — ED Notes (Signed)
Pt c/o central chest pain, cough, fever, near syncope since Thursday.

## 2015-11-07 NOTE — ED Notes (Signed)
Pt told registration he was leaving 

## 2016-06-08 ENCOUNTER — Emergency Department (HOSPITAL_COMMUNITY)
Admission: EM | Admit: 2016-06-08 | Discharge: 2016-06-08 | Disposition: A | Discharge status: Home / Self Care | Payer: No Typology Code available for payment source | Attending: Emergency Medicine | Admitting: Emergency Medicine

## 2016-06-08 ENCOUNTER — Emergency Department (HOSPITAL_COMMUNITY): Payer: No Typology Code available for payment source

## 2016-06-08 ENCOUNTER — Encounter (HOSPITAL_COMMUNITY): Payer: Self-pay | Admitting: Emergency Medicine

## 2016-06-08 DIAGNOSIS — J45909 Unspecified asthma, uncomplicated: Secondary | ICD-10-CM | POA: Diagnosis not present

## 2016-06-08 DIAGNOSIS — Y939 Activity, unspecified: Secondary | ICD-10-CM | POA: Insufficient documentation

## 2016-06-08 DIAGNOSIS — R0781 Pleurodynia: Secondary | ICD-10-CM | POA: Insufficient documentation

## 2016-06-08 DIAGNOSIS — Y92410 Unspecified street and highway as the place of occurrence of the external cause: Secondary | ICD-10-CM | POA: Diagnosis not present

## 2016-06-08 DIAGNOSIS — M549 Dorsalgia, unspecified: Secondary | ICD-10-CM | POA: Diagnosis not present

## 2016-06-08 DIAGNOSIS — M25511 Pain in right shoulder: Secondary | ICD-10-CM | POA: Diagnosis present

## 2016-06-08 DIAGNOSIS — Y999 Unspecified external cause status: Secondary | ICD-10-CM | POA: Insufficient documentation

## 2016-06-08 DIAGNOSIS — Z791 Long term (current) use of non-steroidal anti-inflammatories (NSAID): Secondary | ICD-10-CM | POA: Insufficient documentation

## 2016-06-08 DIAGNOSIS — M25521 Pain in right elbow: Secondary | ICD-10-CM | POA: Diagnosis not present

## 2016-06-08 DIAGNOSIS — R51 Headache: Secondary | ICD-10-CM | POA: Diagnosis not present

## 2016-06-08 DIAGNOSIS — F17210 Nicotine dependence, cigarettes, uncomplicated: Secondary | ICD-10-CM | POA: Diagnosis not present

## 2016-06-08 MED ORDER — CYCLOBENZAPRINE HCL 5 MG PO TABS: 5.0000 mg | ORAL_TABLET | Freq: Three times a day (TID) | ORAL | 0 refills | Status: DC | PRN

## 2016-06-08 MED ORDER — HYDROCODONE-ACETAMINOPHEN 5-325 MG PO TABS
1.0000 | ORAL_TABLET | Freq: Once | ORAL | Status: AC
  Administered 2016-06-08: 1 via ORAL
  Filled 2016-06-08: qty 1

## 2016-06-08 MED ORDER — NAPROXEN 500 MG PO TABS: 500.0000 mg | ORAL_TABLET | Freq: Two times a day (BID) | ORAL | 0 refills | Status: DC

## 2016-06-08 MED ORDER — CYCLOBENZAPRINE HCL 10 MG PO TABS
5.0000 mg | ORAL_TABLET | Freq: Once | ORAL | Status: AC
  Administered 2016-06-08: 5 mg via ORAL
  Filled 2016-06-08: qty 1
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
## 2016-06-08 NOTE — ED Notes (Signed)
PA at bedside.
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
## 2016-06-08 NOTE — Discharge Instructions (Signed)
Read the information below.  Your x-rays are re-assuring. You may have muscle soreness of the next 2-3 days. I have prescribed naprosyn and flexeril for relief. Flexeril can make you drowsy, do not drive after taking. You can apply ice to affected areas. Warm showers may also ease muscle soreness.   Use the prescribed medication as directed.  Please discuss all new medications with your pharmacist.   If symptoms persist, follow up with your primary provider, if you do not have one, I have provided the contact information for [facility].  You may return to the Emergency Department at any time for worsening condition or any new symptoms that concern you. Return to ED if you develop changes in vision, loss of sensation, worsening headache, confusion/lethargy, vomiting, or any new/worsening symptoms.

## 2017-05-23 ENCOUNTER — Encounter (HOSPITAL_BASED_OUTPATIENT_CLINIC_OR_DEPARTMENT_OTHER): Payer: Self-pay | Admitting: *Deleted

## 2017-05-23 ENCOUNTER — Emergency Department (HOSPITAL_BASED_OUTPATIENT_CLINIC_OR_DEPARTMENT_OTHER)
Admission: EM | Admit: 2017-05-23 | Discharge: 2017-05-23 | Disposition: A | Discharge status: Home / Self Care | Payer: Self-pay | Attending: Emergency Medicine | Admitting: Emergency Medicine

## 2017-05-23 DIAGNOSIS — F17290 Nicotine dependence, other tobacco product, uncomplicated: Secondary | ICD-10-CM | POA: Insufficient documentation

## 2017-05-23 DIAGNOSIS — Z79899 Other long term (current) drug therapy: Secondary | ICD-10-CM | POA: Insufficient documentation

## 2017-05-23 DIAGNOSIS — R1013 Epigastric pain: Secondary | ICD-10-CM | POA: Insufficient documentation

## 2017-05-23 DIAGNOSIS — J45901 Unspecified asthma with (acute) exacerbation: Secondary | ICD-10-CM | POA: Insufficient documentation

## 2017-05-23 LAB — CBC
HEMATOCRIT: 46.8 % (ref 39.0–52.0)
HEMOGLOBIN: 16.1 g/dL (ref 13.0–17.0)
MCH: 28.4 pg (ref 26.0–34.0)
MCHC: 34.4 g/dL (ref 30.0–36.0)
MCV: 82.5 fL (ref 78.0–100.0)
Platelets: 202 10*3/uL (ref 150–400)
RBC: 5.67 MIL/uL (ref 4.22–5.81)
RDW: 12.4 % (ref 11.5–15.5)
WBC: 3.9 10*3/uL — AB (ref 4.0–10.5)

## 2017-05-23 LAB — COMPREHENSIVE METABOLIC PANEL
ALT: 28 U/L (ref 17–63)
ANION GAP: 8 (ref 5–15)
AST: 30 U/L (ref 15–41)
Albumin: 3.6 g/dL (ref 3.5–5.0)
Alkaline Phosphatase: 48 U/L (ref 38–126)
BILIRUBIN TOTAL: 0.5 mg/dL (ref 0.3–1.2)
BUN: 11 mg/dL (ref 6–20)
CHLORIDE: 102 mmol/L (ref 101–111)
CO2: 24 mmol/L (ref 22–32)
Calcium: 8.8 mg/dL — ABNORMAL LOW (ref 8.9–10.3)
Creatinine, Ser: 1.27 mg/dL — ABNORMAL HIGH (ref 0.61–1.24)
GFR calc Af Amer: >60 mL/min (ref >60)
Glucose, Bld: 80 mg/dL (ref 65–99)
POTASSIUM: 3.6 mmol/L (ref 3.5–5.1)
Sodium: 134 mmol/L — ABNORMAL LOW (ref 135–145)
Total Protein: 7 g/dL (ref 6.5–8.1)

## 2017-05-23 LAB — LIPASE, BLOOD: Lipase: 18 U/L (ref 11–51)

## 2017-05-23 MED ORDER — IBUPROFEN 400 MG PO TABS
ORAL_TABLET | ORAL | Status: DC
Start: 2017-05-23 — End: 2017-05-23
  Filled 2017-05-23: qty 1

## 2017-05-23 MED ORDER — PREDNISONE 20 MG PO TABS: ORAL_TABLET | ORAL | 0 refills | Status: DC

## 2017-05-23 MED ORDER — GI COCKTAIL ~~LOC~~
30.0000 mL | Freq: Once | ORAL | Status: AC
  Administered 2017-05-23: 30 mL via ORAL
  Filled 2017-05-23: qty 30

## 2017-05-23 MED ORDER — ALBUTEROL SULFATE HFA 108 (90 BASE) MCG/ACT IN AERS
INHALATION_SPRAY | RESPIRATORY_TRACT | Status: DC
Start: 2017-05-23 — End: 2017-05-23
  Filled 2017-05-23: qty 6.7

## 2017-05-23 MED ORDER — LIDOCAINE 4 % EX CREA: 1.0000 "application " | TOPICAL_CREAM | CUTANEOUS | 0 refills | Status: DC | PRN

## 2017-05-23 MED ORDER — IBUPROFEN 400 MG PO TABS
400.0000 mg | ORAL_TABLET | Freq: Once | ORAL | Status: AC
  Administered 2017-05-23: 400 mg via ORAL

## 2017-05-23 MED ORDER — ONDANSETRON HCL 4 MG PO TABS: 4.0000 mg | ORAL_TABLET | Freq: Every day | ORAL | 1 refills | Status: DC | PRN

## 2017-05-23 MED ORDER — ALBUTEROL SULFATE HFA 108 (90 BASE) MCG/ACT IN AERS
2.0000 | INHALATION_SPRAY | RESPIRATORY_TRACT | Status: DC
  Administered 2017-05-23: 2 via RESPIRATORY_TRACT

## 2017-05-23 NOTE — ED Triage Notes (Signed)
Pt with hx of asthma is here because her asthma is acting up.  She also states that her stomach has been bothering her, she has been nauseated but has not vomited.  Pt states that she had 2 episodes of diarrhea and states that she was incontinent of stool on the first episode.  Pt also states that she has been chills.  No one in the household with similar symptoms.

## 2017-05-23 NOTE — ED Notes (Signed)
ED Provider at bedside. 

## 2017-05-23 NOTE — Discharge Instructions (Signed)
I want to to take prednisone over the next 7 days to help with possible asthma exacerbation. I will continue albuterol inhaler as needed. For the nausea I want you to take Zofran as needed. If you your pain suddenly worsens significantly or if there are other symptoms  you can always return for reevaluation. Your lab work, looks like you have not been drinking as much as need to please make sure to increase your fluids.

## 2017-05-23 NOTE — ED Provider Notes (Signed)
I have personally seen and examined the patient. I have reviewed the documentation on PMH/FH/Soc Hx. I have discussed the plan of care with the resident and patient.  I have reviewed and agree with the resident's documentation. Please see associated encounter note.   EKG Interpretation None         Cardama, Amadeo Garnet, MD 05/23/17 1241

## 2017-05-23 NOTE — ED Provider Notes (Signed)
[facility] Provider Note   CSN: [id number] Arrival date & time: 05/23/17  1110     History   Chief Complaint Chief Complaint  Patient presents with  . Asthma    HPI Clarence Simpson is a 31 y.o. male.  HPI   Patient presenting with multiple complaints. States that he has been short of breath for the past two weeks. He has been using his albuterol inhaler back to back. He said in the last 4 hours he used it about 10 times. He states he's had wheezing and cough for about two weeks. He also endorses having some abdominal pain. Endorses feeling slightly nauseated today. He denies any vomiting. Patient states that he feels warm at times but he is never checked his temperature, feels like he may have had fever. He has not been taking any tylenol or ibuprofen. He indicates that the pain is everywhere in his abdomen, no specific spot. Patient indicates he has had diarrhea. He had 1 bowel movement last night which he states was loose. Patient has been eating and drinking. Patient also endorses congestion and sore throat.  Past Medical History:  Diagnosis Date  . 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 Asthma    daily use of inhaler  . Headache(784.0)    migraines    Patient Active Problem List   Diagnosis Date Noted  . Precordial chest pain, possible pericarditis, negative MI 09/02/2014  . Asthma 09/02/2014  . Generalized pain 09/02/2014  . Tobacco abuse 09/02/2014  . Abnormal EKG 09/02/2014  . Protein-calorie malnutrition, severe, with illness 09/02/2014  . Syncope, possibly related to pain and having protein calorie malnutrition 09/02/2014  . 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 LEFT HEART CATHETERIZATION WITH CORONARY ANGIOGRAM N/A 09/02/2014   Procedure: LEFT HEART CATHETERIZATION WITH CORONARY ANGIOGRAM;  Surgeon: Kathleene Hazel, MD;  Location: [facility] CATH LAB;  Service: Cardiovascular;  Laterality: N/A;  . NO PAST SURGERIES     . WART FULGURATION N/A 10/30/2012   Procedure: Fulgeration  Anal condylomata, Rectal exam under anesthesia;  Surgeon: Velora Heckler, MD;  Location: [facility];  Service: General;  Laterality: N/A;       Home Medications    Prior to Admission medications   Medication Sig Start Date End Date Taking? Authorizing Provider  albuterol (PROVENTIL HFA;VENTOLIN HFA) 108 (90 BASE) MCG/ACT inhaler Inhale 2 puffs into the lungs every 6 (six) hours as needed for wheezing (wheezing, sob).   Yes [provider]  aspirin-acetaminophen-caffeine (EXCEDRIN MIGRAINE) [phone] MG per tablet Take 4 tablets by mouth every 6 (six) hours as needed for headache.    [provider]  azithromycin (ZITHROMAX Z-PAK) 250 MG tablet Take 1 tablet (250 mg total) by mouth daily. 12/20/14   Elpidio Anis, PA-C  chlorpheniramine-HYDROcodone (TUSSIONEX PENNKINETIC ER) 10-8 MG/5ML SUER Take 5 mLs by mouth every 12 (twelve) hours as needed for cough. 12/20/14   Elpidio Anis, PA-C  cyclobenzaprine (FLEXERIL) 5 MG tablet Take 1 tablet (5 mg total) by mouth 3 (three) times daily as needed for muscle spasms. 06/08/16   Deborha Payment, PA-C  diazepam (VALIUM) 5 MG tablet Take 1 tablet (5 mg total) by mouth every 8 (eight) hours as needed for muscle spasms (or pain). Patient not taking: Reported on 12/20/2014 08/14/14   Trixie Dredge, PA-C  diphenhydrAMINE (BENADRYL) 25 MG tablet Take 75 mg by mouth every 6 (six) hours as needed for itching or allergies.  [provider]  feeding supplement, RESOURCE BREEZE, (RESOURCE BREEZE) LIQD Take 1 Container by mouth 3 (three) times daily between meals. Patient not taking: Reported on 12/20/2014 09/02/14   Leone Brand, NP  HYDROcodone-acetaminophen (NORCO/VICODIN) 5-325 MG per tablet Take 1 tablet by mouth every 4 (four) hours as needed for moderate pain or severe pain. Patient not taking: Reported on 09/02/2014 08/14/14   Trixie Dredge, PA-C  ibuprofen  (ADVIL,MOTRIN) 400 MG tablet Take 1 tablet (400 mg total) by mouth 2 (two) times daily. Patient not taking: Reported on 12/20/2014 09/08/14   Leone Brand, NP  ibuprofen (ADVIL,MOTRIN) 800 MG tablet Take 1 tablet (800 mg total) by mouth 3 (three) times daily with meals. Patient not taking: Reported on 12/20/2014 09/02/14   Leone Brand, NP  ibuprofen (ADVIL,MOTRIN) 800 MG tablet Take 1 tablet (800 mg total) by mouth 2 (two) times daily. Patient not taking: Reported on 12/20/2014 09/05/14   Leone Brand, NP  lidocaine (ASPERCREME W/LIDOCAINE) 4 % cream Apply 1 application topically as needed. Please apply to chest as needed for pain 05/23/17   Berton Bon, MD  loratadine (CLARITIN) 10 MG tablet Take 10 mg by mouth daily.    [provider]  naproxen (NAPROSYN) 500 MG tablet Take 1 tablet (500 mg total) by mouth 2 (two) times daily. 06/08/16   Deborha Payment, PA-C  ondansetron (ZOFRAN) 4 MG tablet Take 1 tablet (4 mg total) by mouth daily as needed for nausea or vomiting. 05/23/17 05/23/18  Berton Bon, MD  predniSONE (DELTASONE) 20 MG tablet Take  x 2 days,  x 2 days,  x 3 days 05/23/17   Berton Bon, MD    Family History No family history on file.  Social History Social History  Substance Use Topics  . Smoking status: Current Every Day Smoker    Years: 1.00    Types: Cigars  . Smokeless tobacco: Never Used     Comment: smokes Black and Milds - 1/day  . Alcohol use 0.5 oz/week    1 Standard drinks or equivalent per week     Comment: occasionally     Allergies   Patient has no known allergies.   Review of Systems Review of Systems  Constitutional: Negative for chills and fever.  HENT: Positive for congestion and sore throat.   Respiratory: Positive for cough and shortness of breath.   Gastrointestinal: Positive for abdominal pain, diarrhea and nausea. Negative for vomiting.  Neurological: Negative for dizziness and headaches.  All  other systems reviewed and are negative.    Physical Exam Updated Vital Signs BP 134/73   Pulse 75   Temp 98.3 F (36.8 C) (Oral)   Resp (!) 21   Wt 81.6 kg (180 lb)   SpO2 100%   BMI 23.11 kg/m   Physical Exam  Constitutional: He appears well-developed and well-nourished.  HENT:  Head: Normocephalic and atraumatic.  Eyes: Conjunctivae are normal.  Slight erythema in oropharynx  Neck: Neck supple.  Cardiovascular: Normal rate and regular rhythm.   No murmur heard. Pain with palpation of chest   Pulmonary/Chest: Effort normal and breath sounds normal. No respiratory distress.  Abdominal: Soft. He exhibits no distension. There is tenderness. There is no guarding.  Patient with diffuse mild tenderness throughout exam  Musculoskeletal: He exhibits no edema.  Neurological: He is alert.  Skin: Skin is warm and dry.  Psychiatric: He has a normal mood and affect.  Nursing note and vitals  reviewed.    ED Treatments / Results  Labs (all labs ordered are listed, but only abnormal results are displayed) Labs Reviewed  COMPREHENSIVE METABOLIC PANEL - Abnormal; Notable for the following:       Result Value   Sodium 134 (*)    Creatinine, Ser 1.27 (*)    Calcium 8.8 (*)    All other components within normal limits  CBC - Abnormal; Notable for the following:    WBC 3.9 (*)    All other components within normal limits  LIPASE, BLOOD    EKG  EKG Interpretation None       Radiology No results found.  Procedures Procedures (including critical care time)  Medications Ordered in ED Medications  ibuprofen (ADVIL,MOTRIN) 400 MG tablet (not administered)  albuterol (PROVENTIL HFA;VENTOLIN HFA) 108 (90 Base) MCG/ACT inhaler 2 puff (2 puffs Inhalation Given 05/23/17 1336)  gi cocktail (Maalox,Lidocaine,Donnatal) (30 mLs Oral Given 05/23/17 1215)  ibuprofen (ADVIL,MOTRIN) tablet 400 mg (400 mg Oral Given 05/23/17 1338)     Initial Impression / Assessment and Plan / ED  Course  I have reviewed the triage vital signs and the nursing notes.  Pertinent labs & imaging results that were available during my care of the patient were reviewed by me and considered in my medical decision making (see chart for details).  Patient presenting with multiple symptoms consistent with viral infection; given symptoms of congestion, sore throat, nausea. Given history of shortness of breath, albuterol inhaler use, will provide steroid taper. Discussed with patient the need for him to obtain a primary care physician to have better control over his asthma. Patient with mild abdominal tenderness, diffuse not consistent with an acute abdomen likely part of viral process, provide symptomatic treatment of this issue.EKG negative for pericarditis, CBC/CMET/lipase within normal limits. Patient noted to have slight increase in Scr, discussed hydration with patient. Provided Aspercreme for patient's complaint of musculoskeletal chest pain/costochondritis as well as tylenol as needed for pain  Final Clinical Impressions(s) / ED Diagnoses   Final diagnoses:  Mild asthma with exacerbation, unspecified whether persistent  Epigastric pain    New Prescriptions Discharge Medication List as of 05/23/2017  1:19 PM    START taking these medications   Details  ondansetron (ZOFRAN) 4 MG tablet Take 1 tablet (4 mg total) by mouth daily as needed for nausea or vomiting., Starting Thu 05/23/2017, Until Fri 05/23/2018, Print         Merdis Snodgrass, Antionette Poles, MD 05/23/17 1517

## 2017-05-27 ENCOUNTER — Encounter (HOSPITAL_COMMUNITY): Payer: Self-pay

## 2017-05-27 ENCOUNTER — Emergency Department (HOSPITAL_COMMUNITY): Payer: Self-pay

## 2017-05-27 ENCOUNTER — Emergency Department (HOSPITAL_COMMUNITY)
Admission: EM | Admit: 2017-05-27 | Discharge: 2017-05-27 | Discharge status: Against Medical Advice | Payer: Self-pay | Attending: Emergency Medicine | Admitting: Emergency Medicine

## 2017-05-27 DIAGNOSIS — Z79899 Other long term (current) drug therapy: Secondary | ICD-10-CM | POA: Insufficient documentation

## 2017-05-27 DIAGNOSIS — J45909 Unspecified asthma, uncomplicated: Secondary | ICD-10-CM | POA: Insufficient documentation

## 2017-05-27 DIAGNOSIS — F17210 Nicotine dependence, cigarettes, uncomplicated: Secondary | ICD-10-CM | POA: Insufficient documentation

## 2017-05-27 DIAGNOSIS — R0781 Pleurodynia: Secondary | ICD-10-CM | POA: Insufficient documentation

## 2017-05-27 DIAGNOSIS — R509 Fever, unspecified: Secondary | ICD-10-CM | POA: Insufficient documentation

## 2017-05-27 LAB — CBC
HEMATOCRIT: 46 % (ref 39.0–52.0)
HEMOGLOBIN: 16 g/dL (ref 13.0–17.0)
MCH: 29 pg (ref 26.0–34.0)
MCHC: 34.8 g/dL (ref 30.0–36.0)
MCV: 83.5 fL (ref 78.0–100.0)
Platelets: 217 10*3/uL (ref 150–400)
RBC: 5.51 MIL/uL (ref 4.22–5.81)
RDW: 12.2 % (ref 11.5–15.5)
WBC: 5.1 10*3/uL (ref 4.0–10.5)

## 2017-05-27 LAB — BASIC METABOLIC PANEL
ANION GAP: 11 (ref 5–15)
BUN: 7 mg/dL (ref 6–20)
CO2: 23 mmol/L (ref 22–32)
Calcium: 8.7 mg/dL — ABNORMAL LOW (ref 8.9–10.3)
Chloride: 104 mmol/L (ref 101–111)
Creatinine, Ser: 1.31 mg/dL — ABNORMAL HIGH (ref 0.61–1.24)
GFR calc Af Amer: >60 mL/min (ref >60)
GFR calc non Af Amer: >60 mL/min (ref >60)
GLUCOSE: 81 mg/dL (ref 65–99)
POTASSIUM: 3.6 mmol/L (ref 3.5–5.1)
Sodium: 138 mmol/L (ref 135–145)

## 2017-05-27 LAB — TROPONIN I: Troponin I: <0.03 ng/mL (ref <0.03)

## 2017-05-27 MED ORDER — PREDNISONE 20 MG PO TABS
60.0000 mg | ORAL_TABLET | Freq: Once | ORAL | Status: AC
  Administered 2017-05-27: 60 mg via ORAL
  Filled 2017-05-27: qty 3

## 2017-05-27 MED ORDER — PREDNISONE 20 MG PO TABS: 60.0000 mg | ORAL_TABLET | Freq: Every day | ORAL | 0 refills | Status: DC

## 2017-05-27 MED ORDER — IBUPROFEN 800 MG PO TABS
800.0000 mg | ORAL_TABLET | Freq: Once | ORAL | Status: AC
  Administered 2017-05-27: 800 mg via ORAL
  Filled 2017-05-27: qty 1

## 2017-05-27 NOTE — ED Triage Notes (Signed)
Patient c/o intermittent left chest pain and fever. Patient states he took his temperature and it was 110. Patient questioned and stated, that was what it said. Patient states he has not ate a full meal in about 2 weeks.

## 2017-05-27 NOTE — ED Provider Notes (Signed)
[facility]-EMERGENCY DEPT Provider Note   CSN: [id number] Arrival date & time: 05/27/17  1601     History   Chief Complaint Chief Complaint  Patient presents with  . Chest Pain  . Fever    HPI Clarence Simpson is a 31 y.o. male.  The history is provided by the patient.  He complains of sharp left-sided chest pain for the last 3 weeks. Pain is intermittent and lasts a varying amount of time. No clear precipitating factors. It is worse with taking a deep breath, but not with exertion or movement. Nothing else makes it worse, nothing makes it better. He has not taken any medication for it. He has had some dyspnea which she thinks is from his asthma, and he has been using his inhaler more often than normal. He states that he just used his inhaler shortly before I had seen him. He had a fever last night and states it was high, but he does not know how high. He denies nausea or vomiting. He denies diaphoresis. He had been seen 4 days ago at [facility] and diagnosed as having an asthma exacerbation. He denies history of hypertension, diabetes, hyperlipidemia. He does smoke 2 black and mild cigars a day. His father died of a heart attack at age 52. He denies recent surgery, travel. No history of exogenous estrogens. No history of DVT or PE.  Past Medical History:  Diagnosis Date  . 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 Asthma    daily use of inhaler  . Headache(784.0)    migraines    Patient Active Problem List   Diagnosis Date Noted  . Precordial chest pain, possible pericarditis, negative MI 09/02/2014  . Asthma 09/02/2014  . Generalized pain 09/02/2014  . Tobacco abuse 09/02/2014  . Abnormal EKG 09/02/2014  . Protein-calorie malnutrition, severe, with illness 09/02/2014  . Syncope, possibly related to pain and having protein calorie malnutrition 09/02/2014  . 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 LEFT HEART CATHETERIZATION WITH CORONARY  ANGIOGRAM N/A 09/02/2014   Procedure: LEFT HEART CATHETERIZATION WITH CORONARY ANGIOGRAM;  Surgeon: Kathleene Hazel, MD;  Location: [facility] CATH LAB;  Service: Cardiovascular;  Laterality: N/A;  . NO PAST SURGERIES    . WART FULGURATION N/A 10/30/2012   Procedure: Fulgeration  Anal condylomata, Rectal exam under anesthesia;  Surgeon: Velora Heckler, MD;  Location: [facility];  Service: General;  Laterality: N/A;       Home Medications    Prior to Admission medications   Medication Sig Start Date End Date Taking? Authorizing Provider  albuterol (PROVENTIL HFA;VENTOLIN HFA) 108 (90 BASE) MCG/ACT inhaler Inhale 2 puffs into the lungs every 6 (six) hours as needed for wheezing (wheezing, sob).    [provider]  aspirin-acetaminophen-caffeine (EXCEDRIN MIGRAINE) [phone] MG per tablet Take 4 tablets by mouth every 6 (six) hours as needed for headache.    [provider]  azithromycin (ZITHROMAX Z-PAK) 250 MG tablet Take 1 tablet (250 mg total) by mouth daily. 12/20/14   Elpidio Anis, PA-C  chlorpheniramine-HYDROcodone (TUSSIONEX PENNKINETIC ER) 10-8 MG/5ML SUER Take 5 mLs by mouth every 12 (twelve) hours as needed for cough. 12/20/14   Elpidio Anis, PA-C  cyclobenzaprine (FLEXERIL) 5 MG tablet Take 1 tablet (5 mg total) by mouth 3 (three) times daily as needed for muscle spasms. 06/08/16   Deborha Payment, PA-C  diazepam (VALIUM) 5 MG tablet Take 1 tablet (5 mg total)  by mouth every 8 (eight) hours as needed for muscle spasms (or pain). Patient not taking: Reported on 12/20/2014 08/14/14   Trixie Dredge, PA-C  diphenhydrAMINE (BENADRYL) 25 MG tablet Take 75 mg by mouth every 6 (six) hours as needed for itching or allergies.    [provider]  feeding supplement, RESOURCE BREEZE, (RESOURCE BREEZE) LIQD Take 1 Container by mouth 3 (three) times daily between meals. Patient not taking: Reported on 12/20/2014 09/02/14   Leone Brand, NP    HYDROcodone-acetaminophen (NORCO/VICODIN) 5-325 MG per tablet Take 1 tablet by mouth every 4 (four) hours as needed for moderate pain or severe pain. Patient not taking: Reported on 09/02/2014 08/14/14   Trixie Dredge, PA-C  ibuprofen (ADVIL,MOTRIN) 400 MG tablet Take 1 tablet (400 mg total) by mouth 2 (two) times daily. Patient not taking: Reported on 12/20/2014 09/08/14   Leone Brand, NP  ibuprofen (ADVIL,MOTRIN) 800 MG tablet Take 1 tablet (800 mg total) by mouth 3 (three) times daily with meals. Patient not taking: Reported on 12/20/2014 09/02/14   Leone Brand, NP  ibuprofen (ADVIL,MOTRIN) 800 MG tablet Take 1 tablet (800 mg total) by mouth 2 (two) times daily. Patient not taking: Reported on 12/20/2014 09/05/14   Leone Brand, NP  lidocaine (ASPERCREME W/LIDOCAINE) 4 % cream Apply 1 application topically as needed. Please apply to chest as needed for pain 05/23/17   Berton Bon, MD  loratadine (CLARITIN) 10 MG tablet Take 10 mg by mouth daily.    [provider]  naproxen (NAPROSYN) 500 MG tablet Take 1 tablet (500 mg total) by mouth 2 (two) times daily. 06/08/16   Deborha Payment, PA-C  ondansetron (ZOFRAN) 4 MG tablet Take 1 tablet (4 mg total) by mouth daily as needed for nausea or vomiting. 05/23/17 05/23/18  Berton Bon, MD  predniSONE (DELTASONE) 20 MG tablet Take  x 2 days,  x 2 days,  x 3 days 05/23/17   Berton Bon, MD    Family History Family History  Problem Relation Age of Onset  . Heart failure Father     Social History Social History  Substance Use Topics  . Smoking status: Current Every Day Smoker    Years: 1.00    Types: Cigars  . Smokeless tobacco: Never Used     Comment: smokes Black and Milds - 1/day  . Alcohol use 0.5 oz/week    1 Standard drinks or equivalent per week     Comment: occasionally     Allergies   Patient has no known allergies.   Review of Systems Review of Systems  All other systems reviewed  and are negative.    Physical Exam Updated Vital Signs BP 129/83 (BP Location: Left Arm)   Pulse 71   Temp 98.9 F (37.2 C) (Oral) Comment: PT drinking water  Resp 16   Ht  (1.854 m)   Wt 81.6 kg (180 lb)   SpO2 100%   BMI 23.75 kg/m   Physical Exam  Nursing note and vitals reviewed.  31 year old male, resting comfortably and in no acute distress. Vital signs are normal. Oxygen saturation is 100%, which is normal. Head is normocephalic and atraumatic. PERRLA, EOMI. Oropharynx is clear. Neck is nontender and supple without adenopathy or JVD. Back is nontender and there is no CVA tenderness. Lungs are clear without rales, wheezes, or rhonchi. Chest is mildly tender in the left anterolateral rib cage. Moderate gynecomastia present. Heart has regular rate  and rhythm without murmur. Abdomen is soft, flat, nontender without masses or hepatosplenomegaly and peristalsis is normoactive. Extremities have no cyanosis or edema, full range of motion is present. Skin is warm and dry without rash. Neurologic: Mental status is normal, cranial nerves are intact, there are no motor or sensory deficits.  ED Treatments / Results  Labs (all labs ordered are listed, but only abnormal results are displayed) Labs Reviewed  BASIC METABOLIC PANEL - Abnormal; Notable for the following:       Result Value   Creatinine, Ser 1.31 (*)    Calcium 8.7 (*)    All other components within normal limits  CBC  TROPONIN I    EKG  EKG Interpretation  Date/Time:  Monday May 27 2017 09:27:23 EDT Ventricular Rate:  85 PR Interval:    QRS Duration: 79 QT Interval:  350 QTC Calculation: 417 R Axis:   86 Text Interpretation:  Sinus rhythm Probable left atrial enlargement ST elev, probable normal early repol pattern Baseline wander in lead(s) V2 When compared with ECG of 05/23/2017, No significant change was found Confirmed by Dione Booze ([id number]) on 05/27/2017 10:41:13 PM       Radiology Dg  Chest 2 View  Result Date: 05/27/2017 CLINICAL DATA:  Three weeks of mid chest pain EXAM: CHEST  2 VIEW COMPARISON:  Chest x-ray at June 08, 2016 FINDINGS: The lungs are mildly hyperinflated. The interstitial markings are coarse though stable. There is no pneumothorax, pneumomediastinum, or pleural effusion. The heart and pulmonary vascularity are normal. The mediastinum is normal in width. The bony thorax exhibits no acute abnormality. IMPRESSION: Mild hyperinflation likely reflects the patient's history of smoking as well as reactive airway disease. There is no pneumonia nor CHF nor other acute cardiopulmonary abnormality. Electronically Signed   By: Jovante Hammitt  [place] M.D.   On: 05/27/2017 10:31    Procedures Procedures (including critical care time)  Medications Ordered in ED Medications  ibuprofen (ADVIL,MOTRIN) tablet 800 mg (800 mg Oral Given 05/27/17 2233)  predniSONE (DELTASONE) tablet 60 mg (60 mg Oral Given 05/27/17 2232)     Initial Impression / Assessment and Plan / ED Course  I have reviewed the triage vital signs and the nursing notes.  Pertinent labs & imaging results that were available during my care of the patient were reviewed by me and considered in my medical decision making (see chart for details).  Chest pain of uncertain cause. Exam not consistent with costochondritis or pericarditis. Symptom pattern not consistent with ACS. Old records are reviewed, and he had been seen 2 years ago for chest pain and cardiac catheterization have been recommended but patient refused. Stress test was normal at that time. He thinks current pain is similar to that. Lungs are completely clear of which patient attributes to his having had just uses inhaler. His ECG is unchanged from baseline, chest x-ray normal, laboratory workup significant for mild renal insufficiency which is unchanged from recent ED visit. At this point, pain seems to be pleurisy, probably viral. No red flags to suggest more  serious pathology. Negative for PE by [facility] and [facility] - no indication for d-dimer or chest CT angiogram. In spite of slight elevation creatinine, I feel it is safe to go on a short course of NSAIDs. He is given a prescription for prednisone and advised to use over-the-counter naproxen. Patient is very upset at this. He states that prednisone did not work for him the last time. I did explain that each episode is different,  and this should help for both asthma exacerbation and pleurisy. He did not receive steroids when at his recent ED visit. Advised him that no other testing is indicated at this time, and I would not order chest CT because risk of radiation exposure far outweighs any potential information gain. Return precautions discussed.  Final Clinical Impressions(s) / ED Diagnoses   Final diagnoses:  Pleuritic chest pain    New Prescriptions Discharge Medication List as of 05/27/2017 10:34 PM       Dione Booze, MD 05/27/17 2247

## 2017-05-27 NOTE — ED Notes (Signed)
Pt called for room x3 no answer 

## 2017-05-27 NOTE — Discharge Instructions (Signed)
Take two naproxen tablets at a time, twice a day. Take acetaminophen (Tylenol) as needed for additional pain relief.  Stop smoking.  Return if symptoms are getting worse, or if not improving after three days on the current treatment.

## 2017-05-27 NOTE — ED Notes (Signed)
Bed: WTR6 Expected date:  Expected time:  Means of arrival:  Comments: 

## 2017-12-03 IMAGING — CR DG CHEST 2V
2 series · 2 of 2 positions shown · non-contrast
Comparison: Chest x-ray at June 08, 2016

CLINICAL DATA: Three weeks of mid chest pain

EXAM:
CHEST  2 VIEW

[w chest pa]
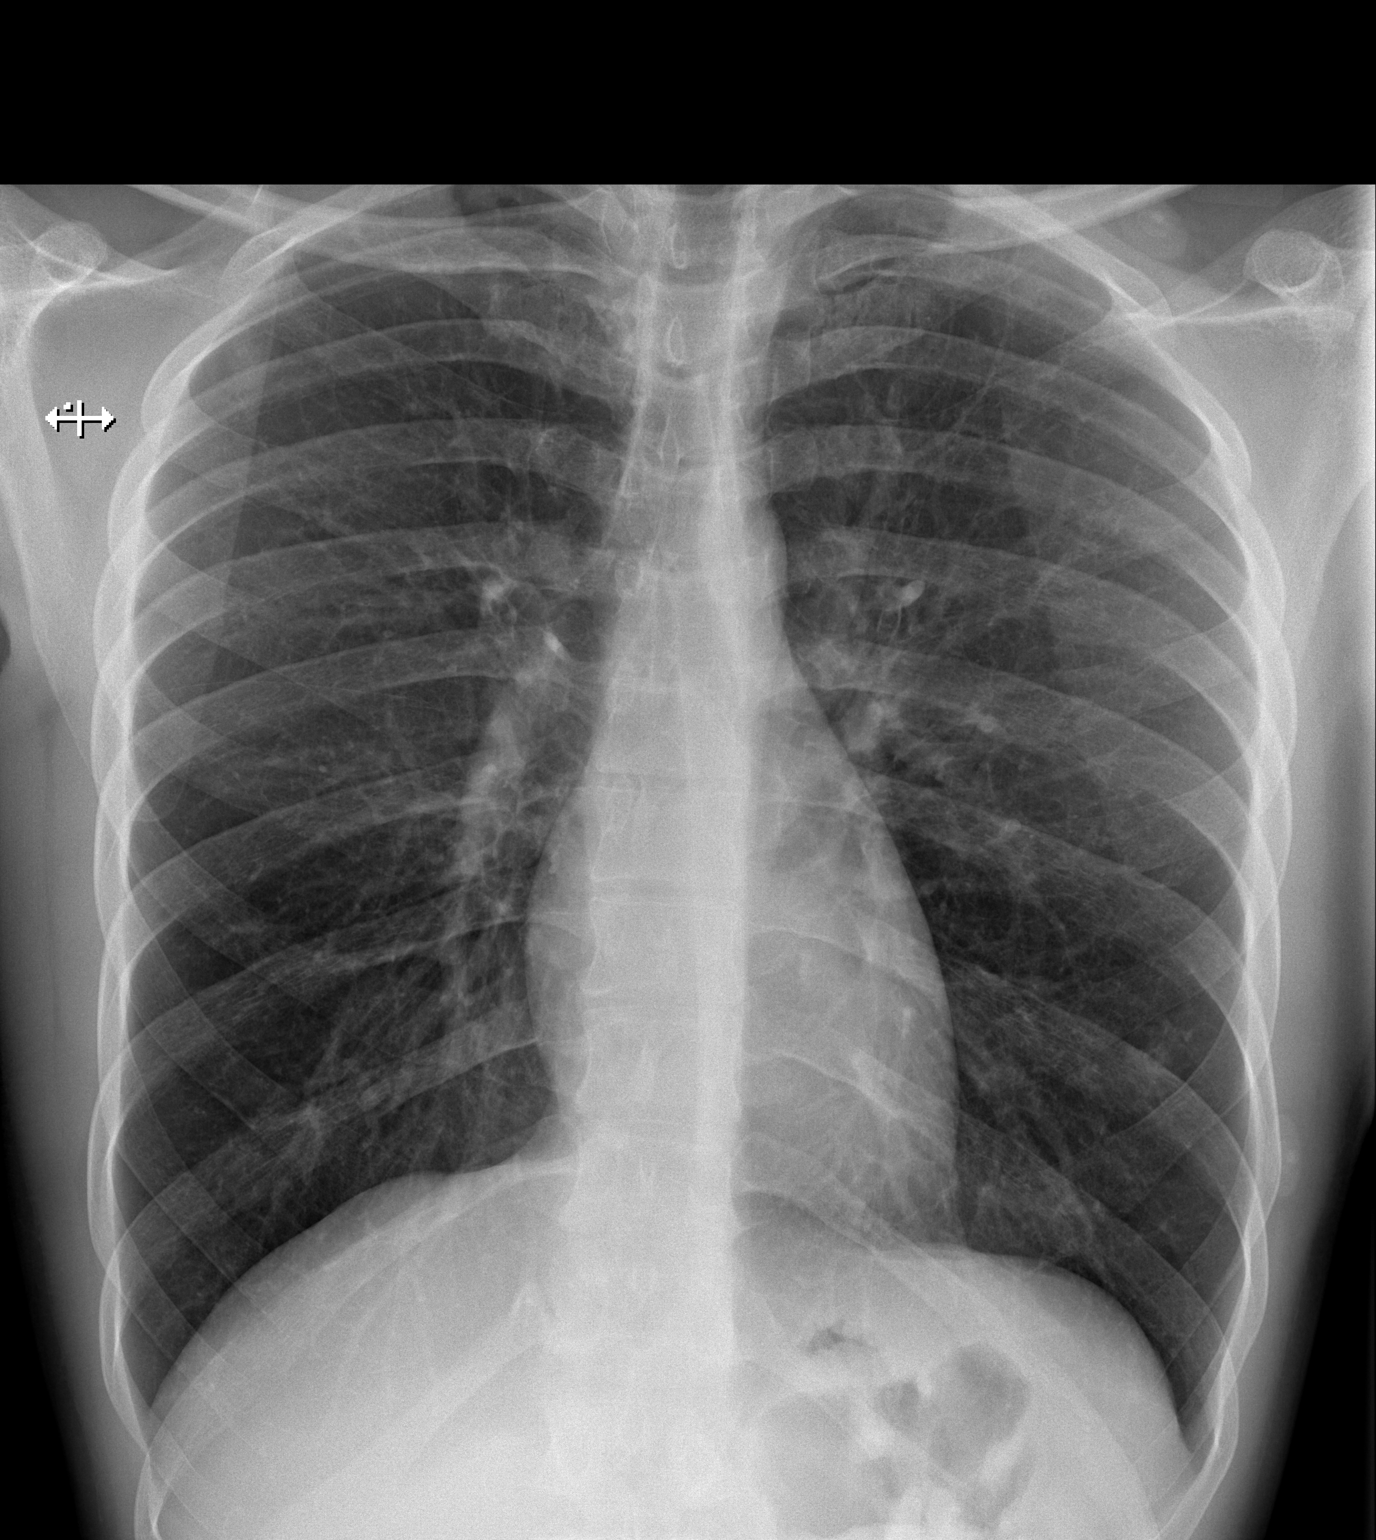

[w chest lat]
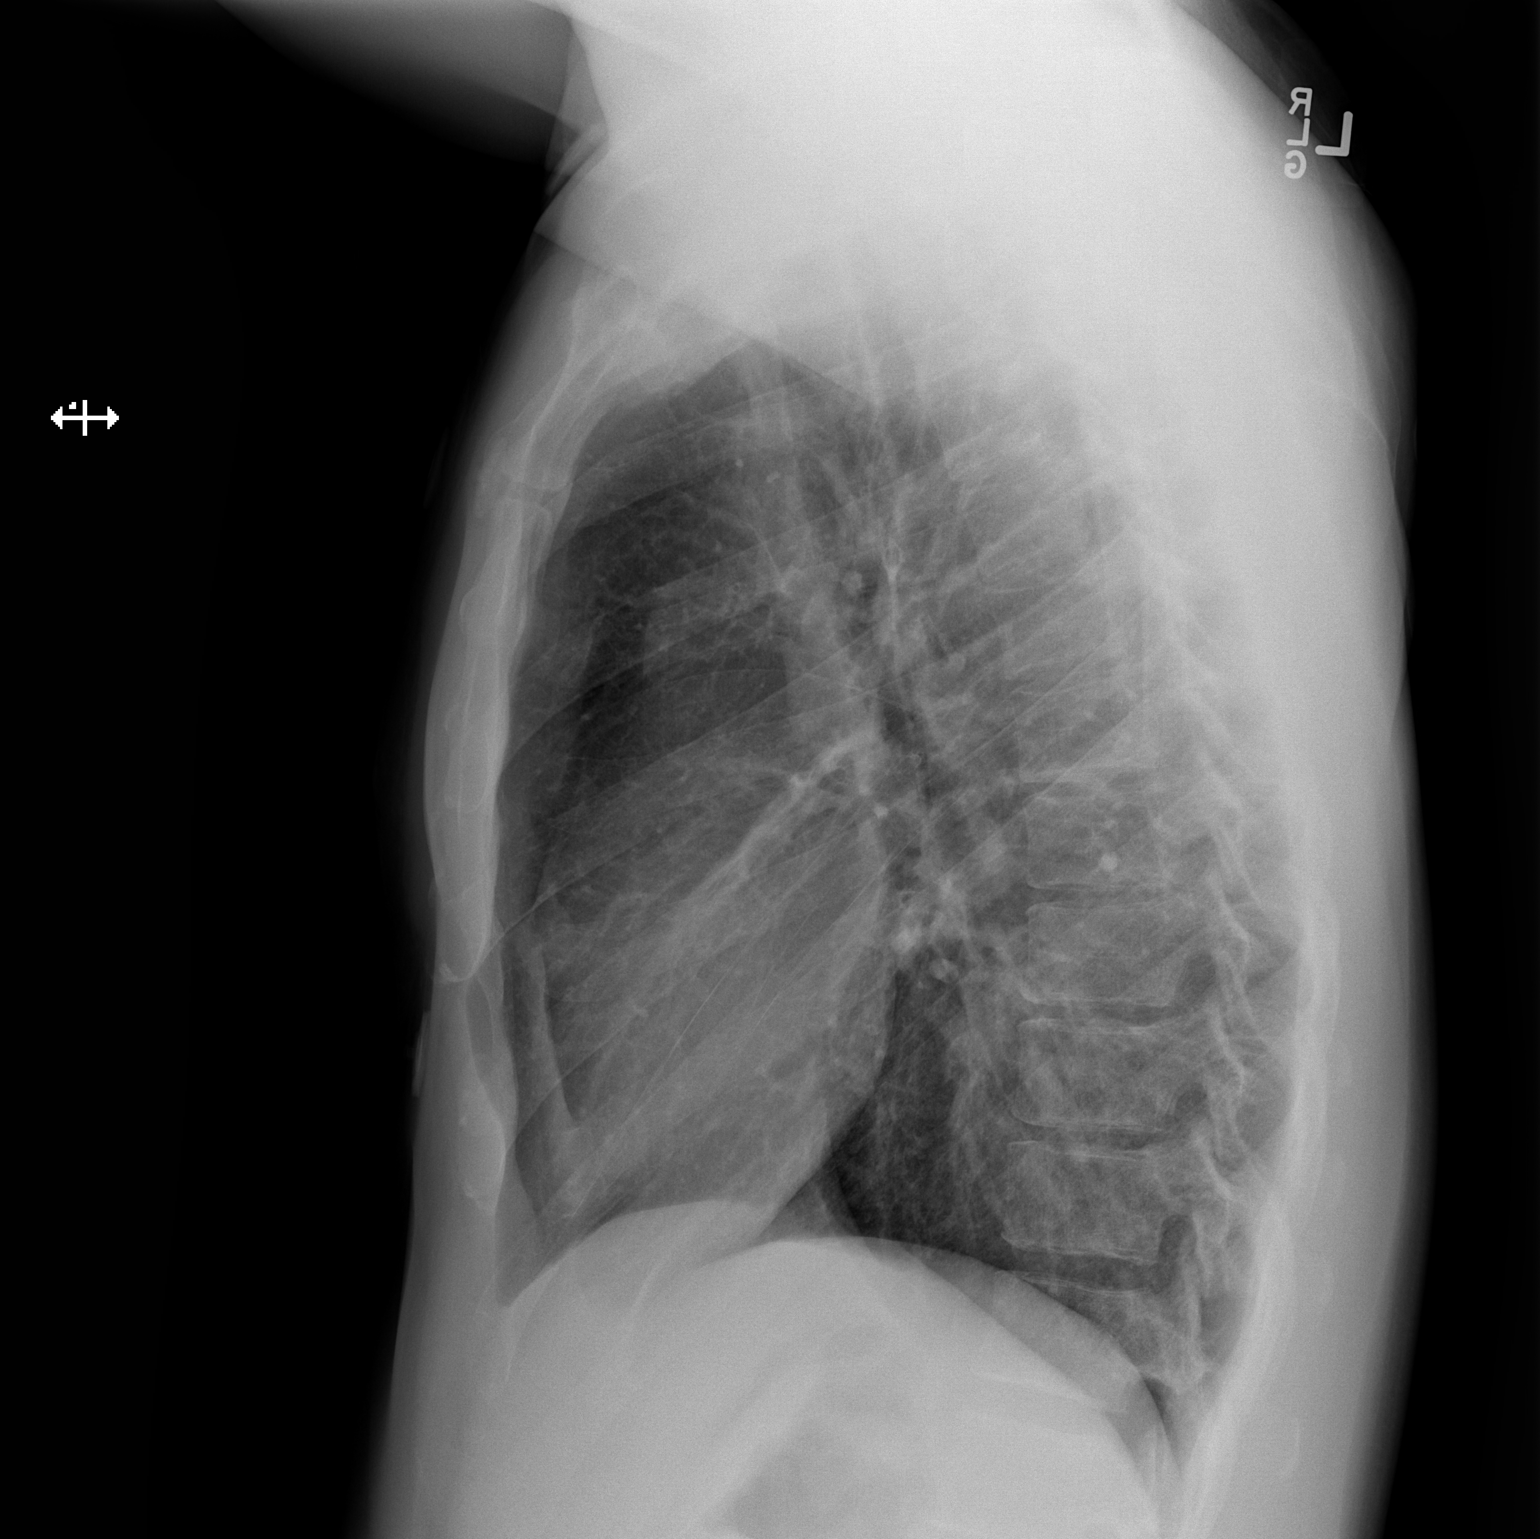

[2 of 2 positions shown; findings below may reference images not displayed]

FINDINGS: The lungs are mildly hyperinflated. The interstitial markings are
coarse though stable. There is no pneumothorax, pneumomediastinum,
or pleural effusion. The heart and pulmonary vascularity are normal.
The mediastinum is normal in width. The bony thorax exhibits no
acute abnormality.
IMPRESSION: Mild hyperinflation likely reflects the patient's history of smoking
as well as reactive airway disease. There is no pneumonia nor CHF
nor other acute cardiopulmonary abnormality.

## 2020-05-22 ENCOUNTER — Encounter (HOSPITAL_COMMUNITY): Payer: Self-pay | Admitting: Emergency Medicine

## 2020-05-22 ENCOUNTER — Other Ambulatory Visit: Payer: Self-pay

## 2020-05-22 ENCOUNTER — Emergency Department (HOSPITAL_COMMUNITY)
Admission: EM | Admit: 2020-05-22 | Discharge: 2020-05-22 | Disposition: A | Discharge status: Home / Self Care | Payer: Self-pay | Attending: Emergency Medicine | Admitting: Emergency Medicine

## 2020-05-22 DIAGNOSIS — J4521 Mild intermittent asthma with (acute) exacerbation: Secondary | ICD-10-CM | POA: Insufficient documentation

## 2020-05-22 DIAGNOSIS — R030 Elevated blood-pressure reading, without diagnosis of hypertension: Secondary | ICD-10-CM | POA: Insufficient documentation

## 2020-05-22 DIAGNOSIS — Z7952 Long term (current) use of systemic steroids: Secondary | ICD-10-CM | POA: Insufficient documentation

## 2020-05-22 DIAGNOSIS — F17290 Nicotine dependence, other tobacco product, uncomplicated: Secondary | ICD-10-CM | POA: Insufficient documentation

## 2020-05-22 DIAGNOSIS — Z79899 Other long term (current) drug therapy: Secondary | ICD-10-CM | POA: Insufficient documentation

## 2020-05-22 MED ORDER — IPRATROPIUM-ALBUTEROL 0.5-2.5 (3) MG/3ML IN SOLN
3.0000 mL | Freq: Once | RESPIRATORY_TRACT | Status: AC
  Administered 2020-05-22: 04:00:00 3 mL via RESPIRATORY_TRACT
  Filled 2020-05-22: qty 3

## 2020-05-22 MED ORDER — PREDNISONE 20 MG PO TABS: 40.0000 mg | ORAL_TABLET | Freq: Every day | ORAL | 0 refills | Status: DC

## 2020-05-22 MED ORDER — PREDNISONE 20 MG PO TABS
60.0000 mg | ORAL_TABLET | Freq: Once | ORAL | Status: AC
  Administered 2020-05-22: 04:00:00 60 mg via ORAL
  Filled 2020-05-22: qty 3

## 2020-05-22 MED ORDER — ALBUTEROL SULFATE HFA 108 (90 BASE) MCG/ACT IN AERS
2.0000 | INHALATION_SPRAY | RESPIRATORY_TRACT | Status: DC | PRN
  Administered 2020-05-22: 04:00:00 2 via RESPIRATORY_TRACT
  Filled 2020-05-22: qty 6.7

## 2020-05-22 NOTE — ED Provider Notes (Signed)
[facility] Provider Note   CSN: [id number] Arrival date & time: 05/22/20  0202     History Chief Complaint  Patient presents with  . Asthma  . Medication Refill    Clarence Simpson is a 34 y.o. adult.  Patient presents to the emergency department with chief complaint of asthma exacerbation.  Patient is a male to male transgender.  She states that she has had wheezing and shortness of breath for the past few days.  She has run out of her albuterol.  She denies any fevers, chills, productive cough.  She denies any exposures to Covid.  Denies any other associated symptoms.  The history is provided by the patient. No language interpreter was used.       Past Medical History:  Diagnosis Date  . 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 Asthma    daily use of inhaler  . Headache(784.0)    migraines    Patient Active Problem List   Diagnosis Date Noted  . Precordial chest pain, possible pericarditis, negative MI 09/02/2014  . Asthma 09/02/2014  . Generalized pain 09/02/2014  . Tobacco abuse 09/02/2014  . Abnormal EKG 09/02/2014  . Protein-calorie malnutrition, severe, with illness 09/02/2014  . Syncope, possibly related to pain and having protein calorie malnutrition 09/02/2014  . 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 LEFT HEART CATHETERIZATION WITH CORONARY ANGIOGRAM N/A 09/02/2014   Procedure: LEFT HEART CATHETERIZATION WITH CORONARY ANGIOGRAM;  Surgeon: Kathleene Hazel, MD;  Location: [facility] CATH LAB;  Service: Cardiovascular;  Laterality: N/A;  . NO PAST SURGERIES    . WART FULGURATION N/A 10/30/2012   Procedure: Fulgeration  Anal condylomata, Rectal exam under anesthesia;  Surgeon: Velora Heckler, MD;  Location: [facility];  Service: General;  Laterality: N/A;       Family History  Problem Relation Age of Onset  . Heart failure Father     Social History   Tobacco Use  .  Smoking status: Current Every Day Smoker    Years: 1.00    Types: Cigars  . Smokeless tobacco: Never Used  . Tobacco comment: smokes Black and Milds - 1/day  Vaping Use  . Vaping Use: Never used  Substance Use Topics  . Alcohol use: Yes    Alcohol/week: 1.0 standard drink    Types: 1 Standard drinks or equivalent per week    Comment: occasionally  . Drug use: No    Home Medications Prior to Admission medications   Medication Sig Start Date End Date Taking? Authorizing Provider  albuterol (PROVENTIL HFA;VENTOLIN HFA) 108 (90 BASE) MCG/ACT inhaler Inhale 2 puffs into the lungs every 6 (six) hours as needed for wheezing (wheezing, sob).    [provider]  loratadine (CLARITIN) 10 MG tablet Take 10 mg by mouth daily.    [provider]  predniSONE (DELTASONE) 20 MG tablet Take 3 tablets (60 mg total) by mouth daily. Take 60mg  x 2 days, 40mg  x 2 days, 20mg  x 3 days 05/27/17   , MD    Allergies    Patient has no known allergies.  Review of Systems   Review of Systems  All other systems reviewed and are negative.   Physical Exam Updated Vital Signs BP (!) 188/137 (BP Location: Left Arm)   Pulse 88   Temp 97.8 F (36.6 C) (Oral)   Resp (!) 22   Ht 6\' 1"  (1.854 m)   Wt 81.6  kg   SpO2 96%   BMI 23.75 kg/m   Physical Exam Vitals and nursing note reviewed.  Constitutional:      General: She is not in acute distress.    Appearance: She is well-developed.  HENT:     Head: Normocephalic and atraumatic.  Eyes:     Conjunctiva/sclera: Conjunctivae normal.  Cardiovascular:     Rate and Rhythm: Normal rate and regular rhythm.     Heart sounds: No murmur heard.   Pulmonary:     Effort: Pulmonary effort is normal. No respiratory distress.     Breath sounds: Wheezing present.  Abdominal:     Palpations: Abdomen is soft.     Tenderness: There is no abdominal tenderness.  Musculoskeletal:     Cervical back: Neck supple.  Skin:    General: Skin  is warm and dry.  Neurological:     Mental Status: She is alert.     ED Results / Procedures / Treatments   Labs (all labs ordered are listed, but only abnormal results are displayed) Labs Reviewed - No data to display  EKG None  Radiology No results found.  Procedures Procedures (including critical care time)  Medications Ordered in ED Medications  ipratropium-albuterol (DUONEB) 0.5-2.5 (3) MG/3ML nebulizer solution 3 mL (has no administration in time range)  predniSONE (DELTASONE) tablet 60 mg (has no administration in time range)  albuterol (VENTOLIN HFA) 108 (90 Base) MCG/ACT inhaler 2 puff (has no administration in time range)    ED Course  I have reviewed the triage vital signs and the nursing notes.  Pertinent labs & imaging results that were available during my care of the patient were reviewed by me and considered in my medical decision making (see chart for details).    MDM Rules/Calculators/A&P                          Patient with mild wheezing.  Will give breathing treatment.  Will give inhaler and prednisone.  Afebrile.  O2 sat is 96% on room air.  Blood pressure is noted to be elevated, unclear whether this is chronic.  I encouraged the patient to follow-up with PCP regarding this. Final Clinical Impression(s) / ED Diagnoses Final diagnoses:  Mild intermittent asthma with exacerbation    Rx / DC Orders ED Discharge Orders         Ordered    predniSONE (DELTASONE) 20 MG tablet  Daily        05/22/20 0322           Roxy Horseman, PA-C 05/22/20 0323    Horton, Mayer Masker, MD 05/22/20 [phone]

## 2020-05-22 NOTE — ED Triage Notes (Signed)
Patient arrives stating his asthma is acting up and that he is out of his inhaler and would like a refill.

## 2020-08-24 ENCOUNTER — Encounter (HOSPITAL_COMMUNITY): Payer: Self-pay

## 2020-08-24 ENCOUNTER — Ambulatory Visit (HOSPITAL_COMMUNITY)
Admission: EM | Admit: 2020-08-24 | Discharge: 2020-08-24 | Disposition: A | Discharge status: Home / Self Care | Payer: Self-pay | Attending: Internal Medicine | Admitting: Internal Medicine

## 2020-08-24 ENCOUNTER — Other Ambulatory Visit: Payer: Self-pay

## 2020-08-24 DIAGNOSIS — R55 Syncope and collapse: Secondary | ICD-10-CM

## 2020-08-24 DIAGNOSIS — R0789 Other chest pain: Secondary | ICD-10-CM

## 2020-08-24 LAB — CBC WITH DIFFERENTIAL/PLATELET
Abs Immature Granulocytes: 0 10*3/uL (ref 0.00–0.07)
Basophils Absolute: 0 10*3/uL (ref 0.0–0.1)
Basophils Relative: 1 %
Eosinophils Absolute: 0 10*3/uL (ref 0.0–0.5)
Eosinophils Relative: 0 %
HCT: 46.3 % (ref 39.0–52.0)
Hemoglobin: 14.9 g/dL (ref 13.0–17.0)
Immature Granulocytes: 0 %
Lymphocytes Relative: 42 %
Lymphs Abs: 1.3 10*3/uL (ref 0.7–4.0)
MCH: 28.2 pg (ref 26.0–34.0)
MCHC: 32.2 g/dL (ref 30.0–36.0)
MCV: 87.5 fL (ref 80.0–100.0)
Monocytes Absolute: 0.5 10*3/uL (ref 0.1–1.0)
Monocytes Relative: 18 %
Neutro Abs: 1.2 10*3/uL — ABNORMAL LOW (ref 1.7–7.7)
Neutrophils Relative %: 39 %
Platelets: 189 10*3/uL (ref 150–400)
RBC: 5.29 MIL/uL (ref 4.22–5.81)
RDW: 13 % (ref 11.5–15.5)
WBC: 3 10*3/uL — ABNORMAL LOW (ref 4.0–10.5)
nRBC: 0 % (ref 0.0–0.2)

## 2020-08-24 LAB — BASIC METABOLIC PANEL
Anion gap: 10 (ref 5–15)
BUN: 9 mg/dL (ref 6–20)
CO2: 25 mmol/L (ref 22–32)
Calcium: 8.9 mg/dL (ref 8.9–10.3)
Chloride: 101 mmol/L (ref 98–111)
Creatinine, Ser: 1.2 mg/dL (ref 0.61–1.24)
GFR, Estimated: >60 mL/min (ref >60)
Glucose, Bld: 119 mg/dL — ABNORMAL HIGH (ref 70–99)
Potassium: 3.7 mmol/L (ref 3.5–5.1)
Sodium: 136 mmol/L (ref 135–145)

## 2020-08-24 MED ORDER — ACETAMINOPHEN 325 MG PO TABS
ORAL_TABLET | ORAL | Status: AC
  Filled 2020-08-24: qty 3

## 2020-08-24 MED ORDER — ACETAMINOPHEN 325 MG PO TABS
975.0000 mg | ORAL_TABLET | Freq: Once | ORAL | Status: AC
  Administered 2020-08-24: 16:00:00 975 mg via ORAL

## 2020-08-24 NOTE — ED Triage Notes (Signed)
Pt presents with chest pain and headache x 1 day. States both arms were numb last night. States "breathing out" increased chest pain.

## 2020-08-24 NOTE — ED Provider Notes (Signed)
[facility]    CSN: [id number] Arrival date & time: 08/24/20  1315      History   Chief Complaint Chief Complaint  Patient presents with  . Chest Pain  . Headache    HPI Clarence Simpson is a 34 y.o. adult comes to the urgent care with complaints of generalized headache and left-sided chest pain over the past day or so.  Patient says his symptoms started yesterday was at work.  Pain is sharp, on the left side of the chest, aggravated by movement and palpation, no known relieving factors and is not associated with chest tightness, wheezing, shortness of breath, diaphoresis.  Pain is nonradiating.  Patient denies any trauma.  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  No fever or chills.  He also complains of global throbbing headache.  He has not tried any over-the-counter medications for that.  No numbness or tingling.  No double vision. HPI  Past Medical History:  Diagnosis Date  . 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 Asthma    daily use of inhaler  . Headache(784.0)    migraines    Patient Active Problem List   Diagnosis Date Noted  . Precordial chest pain, possible pericarditis, negative MI 09/02/2014  . Asthma 09/02/2014  . Generalized pain 09/02/2014  . Tobacco abuse 09/02/2014  . Abnormal EKG 09/02/2014  . Protein-calorie malnutrition, severe, with illness 09/02/2014  . Syncope, possibly related to pain and having protein calorie malnutrition 09/02/2014  . 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 LEFT HEART CATHETERIZATION WITH CORONARY ANGIOGRAM N/A 09/02/2014   Procedure: LEFT HEART CATHETERIZATION WITH CORONARY ANGIOGRAM;  Surgeon: Kathleene Hazel, MD;  Location: [facility] CATH LAB;  Service: Cardiovascular;  Laterality: N/A;  . NO PAST SURGERIES    . WART FULGURATION N/A 10/30/2012   Procedure: Fulgeration  Anal condylomata, Rectal exam under anesthesia;  Surgeon: Velora Heckler, MD;   Location: [facility];  Service: General;  Laterality: N/A;       Home Medications    Prior to Admission medications   Medication Sig Start Date End Date Taking? Authorizing Provider  loratadine (CLARITIN) 10 MG tablet Take 10 mg by mouth daily.  08/24/20  [provider]    Family History Family History  Problem Relation Age of Onset  . Heart failure Father     Social History Social History   Tobacco Use  . Smoking status: Current Every Day Smoker    Years: 1.00    Types: Cigars  . Smokeless tobacco: Never Used  . Tobacco comment: smokes Black and Milds - 1/day  Vaping Use  . Vaping Use: Never used  Substance Use Topics  . Alcohol use: Yes    Alcohol/week: 1.0 standard drink    Types: 1 Standard drinks or equivalent per week    Comment: occasionally  . Drug use: No     Allergies   Patient has no known allergies.   Review of Systems Review of Systems  Constitutional: Negative.   HENT: Negative.   Respiratory: Negative for cough, chest tightness and shortness of breath.   Cardiovascular: Positive for chest pain.  Gastrointestinal: Negative.   Neurological: Positive for headaches. Negative for dizziness, weakness and light-headedness.     Physical Exam Triage Vital Signs ED Triage Vitals  Enc Vitals Group     BP 08/24/20 1511 124/68     Pulse Rate 08/24/20 1511 75  Resp 08/24/20 1511 18     Temp 08/24/20 1511 97.6 F (36.4 C)     Temp Source 08/24/20 1511 Oral     SpO2 08/24/20 1511 100 %     Weight --      Height --      Head Circumference --      Peak Flow --      Pain Score 08/24/20 1509 8     Pain Loc --      Pain Edu? --      Excl. in GC? --    No data found.  Updated Vital Signs BP 124/68 (BP Location: Right Arm)   Pulse 75   Temp 97.6 F (36.4 C) (Oral)   Resp 18   SpO2 100%   Visual Acuity Right Eye Distance:   Left Eye Distance:   Bilateral Distance:    Right Eye Near:   Left Eye Near:     Bilateral Near:     Physical Exam Vitals and nursing note reviewed.  Constitutional:      General: She is not in acute distress.    Appearance: She is well-developed. She is not ill-appearing.  Cardiovascular:     Rate and Rhythm: Normal rate and regular rhythm.     Heart sounds: Normal heart sounds.  Pulmonary:     Effort: Pulmonary effort is normal.     Breath sounds: No decreased breath sounds, wheezing or rhonchi.  Abdominal:     General: Bowel sounds are normal.  Musculoskeletal:     Cervical back: Normal range of motion.  Neurological:     Mental Status: She is alert.      UC Treatments / Results  Labs (all labs ordered are listed, but only abnormal results are displayed) Labs Reviewed  CBC WITH DIFFERENTIAL/PLATELET  BASIC METABOLIC PANEL    EKG   Radiology No results found.  Procedures Procedures (including critical care time)  Medications Ordered in UC Medications  acetaminophen (TYLENOL) tablet 975 mg (975 mg Oral Given 08/24/20 1551)    Initial Impression / Assessment and Plan / UC Course  I have reviewed the triage vital signs and the nursing notes.  Pertinent labs & imaging results that were available during my care of the patient were reviewed by me and considered in my medical decision making (see chart for details).     1.  Chest wall pain: Over-the-counter NSAIDs or Tylenol EKG is negative for acute ST/T wave changes Return precautions given.  2.  Vasovagal syncope: CBC, BMP Increase oral fluid intake Get up from a sitting position or sitting position in the a graded fashion Final Clinical Impressions(s) / UC Diagnoses   Final diagnoses:  Chest wall pain  Vasovagal syncope     Discharge Instructions     Increase oral fluid intake Compression stockings as needed Return to urgent care if you have any further concerns If symptoms are persistent you may need to be seen by a cardiologist to be evaluated further.   ED  Prescriptions    None     PDMP not reviewed this encounter.   Merrilee Jansky, MD 08/24/20 [phone]

## 2020-08-24 NOTE — Discharge Instructions (Addendum)
Increase oral fluid intake Compression stockings as needed Return to urgent care if you have any further concerns If symptoms are persistent you may need to be seen by a cardiologist to be evaluated further.

## 2021-11-08 ENCOUNTER — Other Ambulatory Visit: Payer: Self-pay

## 2021-11-08 ENCOUNTER — Encounter (HOSPITAL_BASED_OUTPATIENT_CLINIC_OR_DEPARTMENT_OTHER): Payer: Self-pay | Admitting: Urology

## 2021-11-08 ENCOUNTER — Emergency Department (HOSPITAL_BASED_OUTPATIENT_CLINIC_OR_DEPARTMENT_OTHER)
Admission: EM | Admit: 2021-11-08 | Discharge: 2021-11-08 | Disposition: A | Discharge status: Home / Self Care | Payer: Self-pay | Attending: Emergency Medicine | Admitting: Emergency Medicine

## 2021-11-08 DIAGNOSIS — Z23 Encounter for immunization: Secondary | ICD-10-CM | POA: Insufficient documentation

## 2021-11-08 DIAGNOSIS — S61012A Laceration without foreign body of left thumb without damage to nail, initial encounter: Secondary | ICD-10-CM | POA: Insufficient documentation

## 2021-11-08 DIAGNOSIS — W278XXA Contact with other nonpowered hand tool, initial encounter: Secondary | ICD-10-CM | POA: Insufficient documentation

## 2021-11-08 MED ORDER — IBUPROFEN 400 MG PO TABS
600.0000 mg | ORAL_TABLET | Freq: Once | ORAL | Status: AC
  Administered 2021-11-08: 600 mg via ORAL
  Filled 2021-11-08: qty 1

## 2021-11-08 MED ORDER — LIDOCAINE HCL (PF) 1 % IJ SOLN
5.0000 mL | Freq: Once | INTRAMUSCULAR | Status: AC
  Administered 2021-11-08: 5 mL
  Filled 2021-11-08: qty 5

## 2021-11-08 MED ORDER — TETANUS-DIPHTH-ACELL PERTUSSIS 5-2.5-18.5 LF-MCG/0.5 IM SUSY
0.5000 mL | PREFILLED_SYRINGE | Freq: Once | INTRAMUSCULAR | Status: AC
  Administered 2021-11-08: 0.5 mL via INTRAMUSCULAR
  Filled 2021-11-08: qty 0.5

## 2021-11-08 MED ORDER — BACITRACIN ZINC 500 UNIT/GM EX OINT
TOPICAL_OINTMENT | Freq: Once | CUTANEOUS | Status: AC
  Administered 2021-11-08: 1 via TOPICAL
  Filled 2021-11-08: qty 28.35

## 2021-11-08 NOTE — ED Triage Notes (Signed)
Left thumb laceration from box cutter ?Well approximated ?Bleeding controlled with pressure  ?Unknown last Tdap  ? ?

## 2021-11-08 NOTE — ED Provider Notes (Signed)
?[facility] EMERGENCY DEPARTMENT ?Provider Note ? ? ?CSN: [id number] ?Arrival date & time: 11/08/21  2050 ? ?  ? ?History ? ?Chief Complaint  ?Patient presents with  ? Laceration  ? ? ?Clarence Simpson is a 36 y.o. adult. ? ?HPI ?36 year old presents after accidentally cutting their thumb with a box cutter.  Cut the left thumb and it was difficult to control bleeding.  Finally able to stop the bleeding when holding pressure.  No numbness or weakness.  It is painful to range the thumb but is able to do it.  Not sure when the last tetanus immunization was. ? ?Home Medications ?Prior to Admission medications   ?Medication Sig Start Date End Date Taking? Authorizing Provider  ?loratadine (CLARITIN) 10 MG tablet Take 10 mg by mouth daily.  08/24/20  [provider]  ?   ? ?Allergies    ?Patient has no known allergies.   ? ?Review of Systems   ?Review of Systems  ?Skin:  Positive for wound.  ?Neurological:  Negative for weakness and numbness.  ? ?Physical Exam ?Updated Vital Signs ?BP (!) 154/98 (BP Location: Right Arm)   Pulse 100   Temp 98 ?F (36.7 ?C) (Oral)   Resp 18   Ht 6\' 1"  (1.854 m)   Wt 85.3 kg   SpO2 100%   BMI 24.80 kg/m?  ?Physical Exam ?Vitals and nursing note reviewed.  ?Constitutional:   ?   Appearance: She is well-developed.  ?HENT:  ?   Head: Normocephalic and atraumatic.  ?Cardiovascular:  ?   Rate and Rhythm: Normal rate and regular rhythm.  ?   Pulses:     ?     Radial pulses are 2+ on the left side.  ?Pulmonary:  ?   Effort: Pulmonary effort is normal.  ?Abdominal:  ?   General: There is no distension.  ?Musculoskeletal:  ?   Comments: Superficial but gaping laceration to the lateral aspect of the thumb. Goes up to but does not involve the nail  ?Skin: ?   General: Skin is warm and dry.  ?Neurological:  ?   Mental Status: She is alert.  ? ? ?ED Results / Procedures / Treatments   ?Labs ?(all labs ordered are listed, but only abnormal results are displayed) ?Labs Reviewed - No  data to display ? ?EKG ?None ? ?Radiology ?No results found. ? ?Procedures ? .Laceration Repair ? ?Date/Time: 11/08/2021 10:30 PM ?Performed by: 11/10/2021, MD ?Authorized by: Pricilla Loveless, MD  ? ?Consent:  ?  Consent obtained:  Verbal ?  Consent given by:  Patient ?Laceration details:  ?  Location:  Finger ?  Finger location:  L thumb ?  Length (cm):  1 ?Pre-procedure details:  ?  Preparation:  Patient was prepped and draped in usual sterile fashion ?Exploration:  ?  Hemostasis achieved with:  Direct pressure ?Treatment:  ?  Area cleansed with:  Saline ?  Amount of cleaning:  Standard ?Skin repair:  ?  Repair method:  Sutures ?  Suture size:  4-0 ?  Suture material:  Prolene ?  Suture technique:  Simple interrupted ?  Number of sutures:  2 ?Approximation:  ?  Approximation:  Close ?Repair type:  ?  Repair type:  Simple ?Post-procedure details:  ?  Dressing:  Antibiotic ointment ?  Procedure completion:  Tolerated well, no immediate complications  ? ? ?Medications Ordered in ED ?Medications  ?lidocaine (PF) (XYLOCAINE) 1 % injection 5 mL (5 mLs Infiltration Given by Other 11/08/21  2214)  ?Tdap (BOOSTRIX) injection 0.5 mL (0.5 mLs Intramuscular Given 11/08/21 2138)  ?ibuprofen (ADVIL) tablet 600 mg (600 mg Oral Given 11/08/21 2137)  ?lidocaine (PF) (XYLOCAINE) 1 % injection 5 mL (5 mLs Infiltration Given by Other 11/08/21 2215)  ?bacitracin ointment (1 application. Topical Given 11/08/21 2234)  ? ? ?ED Course/ Medical Decision Making/ A&P ?  ?                        ?Medical Decision Making ?Risk ?OTC drugs. ?Prescription drug management. ? ? ?Patient presents with a distal thumb laceration.  This was cleaned and repaired after numbing.  Wound was superficial so no x-rays indicated.  We will apply bacitracin and we discussed wound care.  Stable for discharge home.  Tetanus updated and given ibuprofen. ? ? ? ? ? ? ? ?Final Clinical Impression(s) / ED Diagnoses ?Final diagnoses:  ?Laceration of left thumb without  foreign body without damage to nail, initial encounter  ? ? ?Rx / DC Orders ?ED Discharge Orders   ? ? None  ? ?  ? ? ?  ?Pricilla Loveless, MD ?11/08/21 2323 ? ?

## 2021-11-08 NOTE — ED Notes (Signed)
Written and verbal inst to pt  Verbalized an understanding  To home with friend ?

## 2021-11-08 NOTE — Discharge Instructions (Addendum)
Return to the ER if you develop fever, drainage, increasing pain, streaking redness, or any other new/concerning symptoms. ? ?If you decide to bandage it, change the bandage daily and make sure you are cleaning it gently with soap and water. ?

## 2021-11-16 ENCOUNTER — Other Ambulatory Visit: Payer: Self-pay

## 2021-11-16 ENCOUNTER — Encounter (HOSPITAL_BASED_OUTPATIENT_CLINIC_OR_DEPARTMENT_OTHER): Payer: Self-pay

## 2021-11-16 ENCOUNTER — Emergency Department (HOSPITAL_BASED_OUTPATIENT_CLINIC_OR_DEPARTMENT_OTHER)
Admission: EM | Admit: 2021-11-16 | Discharge: 2021-11-16 | Disposition: A | Discharge status: Home / Self Care | Payer: Self-pay | Attending: Emergency Medicine | Admitting: Emergency Medicine

## 2021-11-16 DIAGNOSIS — Z4802 Encounter for removal of sutures: Secondary | ICD-10-CM | POA: Insufficient documentation

## 2021-11-16 NOTE — ED Provider Notes (Signed)
?[facility] EMERGENCY DEPARTMENT ?Provider Note ? ? ?CSN: [id number] ?Arrival date & time: 11/16/21  1223 ? ?  ? ?History ? ?Chief Complaint  ?Patient presents with  ? Suture / Staple Removal  ? ? ?Clarence Simpson is a 36 y.o. adult who presents emergency department for suture removal.  Patient had 2 sutures placed in the left thumb on 3/15.  No fevers, chills.  States that wound has looked good, with no increased redness, tenderness or drainage of pus. ? ? ?Suture / Staple Removal ? ?  ? ?Home Medications ?Prior to Admission medications   ?Medication Sig Start Date End Date Taking? Authorizing Provider  ?loratadine (CLARITIN) 10 MG tablet Take 10 mg by mouth daily.  08/24/20  [provider]  ?   ? ?Allergies    ?Patient has no known allergies.   ? ?Review of Systems   ?Review of Systems  ?Skin:  Positive for wound.  ?All other systems reviewed and are negative. ? ?Physical Exam ?Updated Vital Signs ?BP 120/78 (BP Location: Left Arm)   Pulse 95   Temp 98 ?F (36.7 ?C) (Oral)   Resp 16   SpO2 100%  ?Physical Exam ?Vitals and nursing note reviewed.  ?Constitutional:   ?   Appearance: Normal appearance.  ?HENT:  ?   Head: Normocephalic and atraumatic.  ?Eyes:  ?   Conjunctiva/sclera: Conjunctivae normal.  ?Pulmonary:  ?   Effort: Pulmonary effort is normal. No respiratory distress.  ?Skin: ?   General: Skin is warm and dry.  ?   Comments: Partially healed laceration to the left lateral thumb, without nail involvement.  2 sutures in place.  ?Neurological:  ?   Mental Status: She is alert.  ?Psychiatric:     ?   Mood and Affect: Mood normal.     ?   Behavior: Behavior normal.  ? ? ?ED Results / Procedures / Treatments   ?Labs ?(all labs ordered are listed, but only abnormal results are displayed) ?Labs Reviewed - No data to display ? ?EKG ?None ? ?Radiology ?No results found. ? ?Procedures ?Marland KitchenSuture Removal ? ?Date/Time: 11/16/2021 2:23 PM ?Performed by: Su Monks, PA-C ?Authorized by:  Su Monks, PA-C  ? ?Consent:  ?  Consent obtained:  Verbal ?  Consent given by:  Patient ?  Risks, benefits, and alternatives were discussed: yes   ?  Risks discussed:  Pain and wound separation ?Universal protocol:  ?  Patient identity confirmed:  Verbally with patient ?Location:  ?  Location:  Upper extremity ?  Upper extremity location:  Hand ?  Hand location:  L thumb ?Procedure details:  ?  Wound appearance:  No signs of infection and good wound healing ?  Number of sutures removed:  2 ?Post-procedure details:  ?  Post-removal:  No dressing applied ?  Procedure completion:  Tolerated well, no immediate complications  ? ? ?Medications Ordered in ED ?Medications - No data to display ? ?ED Course/ Medical Decision Making/ A&P ?  ?                        ?Medical Decision Making ? ?Patient is a 36 year old adult who presents emergency department for suture removal.  Patient had 2 sutures placed in the left thumb on 3/15.  No antibiotics were given. ? ?On my exam patient is afebrile, no acute distress.  There is a partially healed laceration to the left lateral thumb without nail involvement.  No  overlying erythema, fluctuance or drainage of pus.  2 sutures in place.  These were removed without difficulty. ? ?Patient was discharged in stable condition.  They were encouraged to wrap the wound as needed, and return precautions were given. ? ?Final Clinical Impression(s) / ED Diagnoses ?Final diagnoses:  ?Visit for suture removal  ? ? ?Rx / DC Orders ?ED Discharge Orders   ? ? None  ? ?  ? ?Portions of this report may have been transcribed using voice recognition software. Every effort was made to ensure accuracy; however, inadvertent computerized transcription errors may be present. ? ?  ?Su Monks, PA-C ?11/16/21 1423 ? ?  ?Sloan Leiter, DO ?11/17/21 2040 ? ?

## 2021-11-16 NOTE — Discharge Instructions (Addendum)
You are seen in the emergency department today for suture removal. ? ?We have removed the 2 sutures, you can continue wearing a bandage at home. ?

## 2021-11-16 NOTE — ED Triage Notes (Signed)
Pt for suture removal left thumb-placed 3/15-NAD-steady gait ?

## 2021-11-16 NOTE — ED Notes (Signed)
Sutured area clean and dry, no signs of infection, wound margins well proximated.  Suture removal kit placed at bedside for provider ?

## 2021-12-12 ENCOUNTER — Other Ambulatory Visit (HOSPITAL_COMMUNITY): Payer: Self-pay

## 2021-12-12 ENCOUNTER — Telehealth: Payer: Self-pay

## 2021-12-12 NOTE — Telephone Encounter (Signed)
[facility] Patient Advocate Encounter ? ?Insurance verification completed.   ? ?The patient is uninsured and will need patient assistance for medication. ? ?We can complete the application and will need to meet with the patient for signatures and income documentation. ? ?Ashwika Freels, CPhT ?Specialty Pharmacy Patient Advocate ?[facility] ?Phone: [phone] ?Fax:  [phone]  ?

## 2021-12-13 ENCOUNTER — Encounter: Payer: Self-pay | Admitting: Infectious Disease

## 2021-12-13 ENCOUNTER — Encounter: Payer: Self-pay | Admitting: *Deleted

## 2021-12-13 ENCOUNTER — Ambulatory Visit: Payer: Self-pay | Admitting: Pharmacist

## 2021-12-13 ENCOUNTER — Other Ambulatory Visit: Payer: Self-pay

## 2021-12-13 ENCOUNTER — Ambulatory Visit: Payer: Self-pay

## 2021-12-13 ENCOUNTER — Ambulatory Visit (INDEPENDENT_AMBULATORY_CARE_PROVIDER_SITE_OTHER): Payer: Self-pay | Admitting: Infectious Disease

## 2021-12-13 VITALS — BP 121/79 | HR 80 | Temp 98.0°F | Wt 185.4 lb

## 2021-12-13 VITALS — BP 159/76 | HR 98 | Temp 98.7°F | Resp 16 | Wt 184.0 lb

## 2021-12-13 DIAGNOSIS — S61111A Laceration without foreign body of right thumb with damage to nail, initial encounter: Secondary | ICD-10-CM

## 2021-12-13 DIAGNOSIS — Z006 Encounter for examination for normal comparison and control in clinical research program: Secondary | ICD-10-CM

## 2021-12-13 DIAGNOSIS — Z720 Tobacco use: Secondary | ICD-10-CM

## 2021-12-13 DIAGNOSIS — A630 Anogenital (venereal) warts: Secondary | ICD-10-CM

## 2021-12-13 DIAGNOSIS — S61219A Laceration without foreign body of unspecified finger without damage to nail, initial encounter: Secondary | ICD-10-CM

## 2021-12-13 DIAGNOSIS — B20 Human immunodeficiency virus [HIV] disease: Secondary | ICD-10-CM

## 2021-12-13 DIAGNOSIS — Z789 Other specified health status: Secondary | ICD-10-CM

## 2021-12-13 HISTORY — DX: Laceration without foreign body of unspecified finger without damage to nail, initial encounter: S61.219A

## 2021-12-13 HISTORY — DX: Human immunodeficiency virus (HIV) disease: B20
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
## 2021-12-14 NOTE — Research (Signed)
Clarence Simpson here for screening visit for the Merck study, [id number]. I explained/reviewed the inform consent with her. Risk, benefits, responsibilities and other options were reviewed. I answered her questions. Comprehension of the study was assessed. She was given adequate time to consider options. She verbalized understanding and signed the informed consent witnessed by me. Signed consent was obtained prior to any procedures being done. She is newly diagnosed with HIV and  has not received any treatment for her HIV. She has a history of asthma and migraines. States that she uses a friends ventolin and albuterol inhalers as needed and takes excedrine for her migraines. We did discuss the need to establish care with a PCP and I provided her with numbers to a couple of [place] clinics that she could choose between for care. She is scheduled for entry on 01/03/22 pending confirmation of eligibility.  ?

## 2022-01-03 ENCOUNTER — Encounter: Payer: Self-pay | Admitting: *Deleted

## 2022-01-10 ENCOUNTER — Encounter: Payer: Self-pay | Admitting: Infectious Disease

## 2022-01-10 ENCOUNTER — Other Ambulatory Visit: Payer: Self-pay | Admitting: Infectious Disease

## 2022-01-10 ENCOUNTER — Ambulatory Visit (INDEPENDENT_AMBULATORY_CARE_PROVIDER_SITE_OTHER): Payer: Self-pay | Admitting: Infectious Disease

## 2022-01-10 ENCOUNTER — Other Ambulatory Visit: Payer: Self-pay

## 2022-01-10 VITALS — BP 117/74 | HR 90 | Temp 97.0°F | Wt 187.0 lb

## 2022-01-10 DIAGNOSIS — L739 Follicular disorder, unspecified: Secondary | ICD-10-CM

## 2022-01-10 DIAGNOSIS — B20 Human immunodeficiency virus [HIV] disease: Secondary | ICD-10-CM

## 2022-01-10 DIAGNOSIS — F321 Major depressive disorder, single episode, moderate: Secondary | ICD-10-CM

## 2022-01-10 DIAGNOSIS — J45901 Unspecified asthma with (acute) exacerbation: Secondary | ICD-10-CM

## 2022-01-10 HISTORY — DX: Follicular disorder, unspecified: L73.9

## 2022-01-10 MED ORDER — ALBUTEROL SULFATE HFA 108 (90 BASE) MCG/ACT IN AERS: 2.0000 | INHALATION_SPRAY | Freq: Four times a day (QID) | RESPIRATORY_TRACT | 4 refills | Status: DC | PRN

## 2022-01-10 MED ORDER — PREDNISONE 20 MG PO TABS: 40.0000 mg | ORAL_TABLET | Freq: Every day | ORAL | 0 refills | Status: AC

## 2022-01-10 MED ORDER — ALBUTEROL SULFATE HFA 108 (90 BASE) MCG/ACT IN AERS: 2.0000 | INHALATION_SPRAY | Freq: Four times a day (QID) | RESPIRATORY_TRACT | 4 refills | Status: AC | PRN

## 2022-01-10 NOTE — Progress Notes (Signed)
? ?Subjective:  ?Chief Complaint: Problems with breathing related to asthma also with concerns about an area that looks like folliculitis ? ? Patient ID: Clarence Simpson, adult    DOB: 02-Sep-1985, 36 y.o.   MRN: [id number] ? ?HPI ? ?Clarence Simpson is a 36 year old Black woman with newly diagnosed HIV with wild type virus and no hepatitis b infection, presents for follow-up. ? ?He is about to enroll in the Merck study comparing Islatravir/Doravirine vs [employer]. ? ?She had schedule clinic today but is having trouble with asthma flares that she is having on a weekly basis. ? ?She had been using an albuterol metered-dose inhaler that she had borrowed from a friend that was dated as having expired in 2013. ? ?She also has an area of what appears to be resolving folliculitis along top of pubic hair. ? ?She has been suffering from depressive symptoms since being diagnosed with HIV and is open to a phone or video visit with Okey Regal. ? ? ? ?Past Medical History:  ?Diagnosis Date  ? Anal warts 02/2012  ? Asthma   ? daily use of inhaler  ? Finger laceration 12/13/2021  ? Headache(784.0)   ? migraines  ? HIV disease (HCC) 12/13/2021  ? ? ?Past Surgical History:  ?Procedure Laterality Date  ? LEFT HEART CATHETERIZATION WITH CORONARY ANGIOGRAM N/A 09/02/2014  ? Procedure: LEFT HEART CATHETERIZATION WITH CORONARY ANGIOGRAM;  Surgeon: Kathleene Hazel, MD;  Location: [facility] CATH LAB;  Service: Cardiovascular;  Laterality: N/A;  ? NO PAST SURGERIES    ? WART FULGURATION N/A 10/30/2012  ? Procedure: Fulgeration  Anal condylomata, Rectal exam under anesthesia;  Surgeon: Velora Heckler, MD;  Location: [facility];  Service: General;  Laterality: N/A;  ? ? ?Family History  ?Problem Relation Age of Onset  ? Heart failure Father   ? ? ?  ?Social History  ? ?Socioeconomic History  ? Marital status: Single  ?  Spouse name: Not on file  ? Number of children: Not on file  ? Years of education: Not on file  ? Highest education level:  Not on file  ?Occupational History  ? Not on file  ?Tobacco Use  ? Smoking status: Every Day  ?  Types: Cigars  ? Smokeless tobacco: Never  ? Tobacco comments:  ?  smokes Black and Milds - 1/day  ?Vaping Use  ? Vaping Use: Never used  ?Substance and Sexual Activity  ? Alcohol use: Yes  ?  Alcohol/week: 1.0 standard drink  ?  Types: 1 Standard drinks or equivalent per week  ?  Comment: occasionally  ? Drug use: No  ? Sexual activity: Not on file  ?Other Topics Concern  ? Not on file  ?Social History Narrative  ? Not on file  ? ?Social Determinants of Health  ? ?Financial Resource Strain: Not on file  ?Food Insecurity: Not on file  ?Transportation Needs: Not on file  ?Physical Activity: Not on file  ?Stress: Not on file  ?Social Connections: Not on file  ? ? ?No Known Allergies ? ?No current outpatient medications on file. ? ? ?Review of Systems  ?Constitutional:  Negative for activity change, appetite change, chills, diaphoresis, fatigue, fever and unexpected weight change.  ?HENT:  Negative for congestion, rhinorrhea, sinus pressure, sneezing, sore throat and trouble swallowing.   ?Eyes:  Negative for photophobia and visual disturbance.  ?Respiratory:  Positive for shortness of breath and wheezing. Negative for cough, chest tightness and stridor.   ?Cardiovascular:  Negative for chest pain, palpitations and leg swelling.  ?Gastrointestinal:  Negative for abdominal distention, abdominal pain, anal bleeding, blood in stool, constipation, diarrhea, nausea and vomiting.  ?Genitourinary:  Negative for difficulty urinating, dysuria, flank pain and hematuria.  ?Musculoskeletal:  Negative for arthralgias, back pain, gait problem, joint swelling and myalgias.  ?Skin:  Negative for color change, pallor, rash and wound.  ?Neurological:  Negative for dizziness, tremors, weakness and light-headedness.  ?Hematological:  Negative for adenopathy. Does not bruise/bleed easily.  ?Psychiatric/Behavioral:  Positive for dysphoric mood.  Negative for agitation, behavioral problems, confusion, decreased concentration and sleep disturbance.   ? ?   ?Objective:  ? Physical Exam ?Constitutional:   ?   Appearance: She is well-developed.  ?HENT:  ?   Head: Normocephalic and atraumatic.  ?Eyes:  ?   Conjunctiva/sclera: Conjunctivae normal.  ?Cardiovascular:  ?   Rate and Rhythm: Normal rate and regular rhythm.  ?Pulmonary:  ?   Effort: Pulmonary effort is normal. No respiratory distress.  ?   Breath sounds: No stridor. No wheezing or rhonchi.  ?Abdominal:  ?   General: There is no distension.  ?   Palpations: Abdomen is soft.  ?Genitourinary: ?   Comments: Two areas of what appear to be resolving follicultlis ?Musculoskeletal:     ?   General: No tenderness. Normal range of motion.  ?   Cervical back: Normal range of motion and neck supple.  ?Skin: ?   General: Skin is warm and dry.  ?   Coloration: Skin is not pale.  ?   Findings: No erythema or rash.  ?Neurological:  ?   General: No focal deficit present.  ?   Mental Status: She is alert and oriented to person, place, and time.  ?Psychiatric:     ?   Mood and Affect: Mood normal.     ?   Behavior: Behavior normal.     ?   Thought Content: Thought content normal.     ?   Judgment: Judgment normal.  ? ? ? ? ? ?   ?Assessment & Plan:  ? ?Asthma: I have written prescription for albuterol and sent to [facility] on [address] [place].  We will endeavor to get pharmaceutical assistance for an inhaled corticosteroid which she clearly needs to help reduce baseline inflammation.  I also called in a prescription for prednisone 40 mg times a week that I have also sent to the [facility] in [place] since she currently not have access to inhaled steroid and asthma flare was fairly significant in the last week. ? ?She currently does not appear in any acute distress but says that she took some of the albuterol that she had shortly before coming into clinic ? ?Depression: will have her meet with Okey Regal and I am also happy to rx an  SSRI ? ?HIV disease: We will start meds through the study tomorrow and I think she will feel a lot better as her virus comes under control which I would expect to happen within the first month. ? ?Folliculitis: Seems resolving but if recurs again would give doxycycline and also consider decolonization regimen. ? ? Transgender health: if allowed to co-enroll into ACTG study would also consider this for her ?

## 2022-01-11 ENCOUNTER — Other Ambulatory Visit: Payer: Self-pay

## 2022-01-11 ENCOUNTER — Encounter (INDEPENDENT_AMBULATORY_CARE_PROVIDER_SITE_OTHER): Payer: Self-pay | Admitting: *Deleted

## 2022-01-11 VITALS — BP 120/85 | HR 73 | Temp 97.9°F | Wt 180.1 lb

## 2022-01-11 DIAGNOSIS — Z006 Encounter for examination for normal comparison and control in clinical research program: Secondary | ICD-10-CM

## 2022-01-11 MED ORDER — STUDY - MK-8591A-053 - BICTEGRAVIR/EMTRICITABINE/TENOFOVIR ALAFENAMIDE (BIKTARVY) 50-200-25 MG OR PLACEBO TABLET (PI-VAN DAM): 1.0000 | ORAL_TABLET | Freq: Every day | ORAL | 0 refills | Status: DC

## 2022-01-11 MED ORDER — STUDY - MK-8591A-053 - MK-8591A 100/0.25 MG OR PLACEBO TABLET (PI-VAN DAM): 1.0000 | ORAL_TABLET | Freq: Every day | ORAL | 0 refills | Status: DC

## 2022-01-11 NOTE — Research (Signed)
Clarence Simpson here for her entry visit to [id number] study. She was seen yesterday by Dr, Daiva Eves for her asthma. She has not been able to get her inhaler and prednisone that was prescribed at her visit yesterday due to cost of the medications. We were able to provide her with a medication co-pay card. She plans to go by the pharmacy later this morning. She agreed to let us know if she had any challenges is getting her medications today. Bilateral inspiratory/expiratory wheezing noted. No acute distress noted. No other complaints verbalized. Reviewed possible side effect of study medication. Initial dose taken at study visit today witnessed by me. She understands that she needs to take medication everyday around the same time each day. She plans to take her medication in the mornings. Contact information provided to her should she have any questions or concerns. She will return in June for her next study visit.

## 2022-01-19 ENCOUNTER — Telehealth: Payer: Self-pay

## 2022-01-19 NOTE — Telephone Encounter (Signed)
[facility] Patient Advocate Encounter  Completed and sent [place] Patient Assistance application for Symbicort for this patient who is uninsured.    Patient is approved 01/16/22 through 01/17/23.  Medication will be shipped 7-10 business days. To check status of shipment [email or web address]://tate.info/ or 442-810-2763  I have spoken to patient and she is aware.   Ileene Patrick, [facility] Patient [facility] Phone: [phone] Fax:  [phone]

## 2022-01-24 ENCOUNTER — Encounter: Payer: Self-pay | Admitting: *Deleted

## 2022-01-24 LAB — CD4/CD8 (T-HELPER/T-SUPPRESSOR CELL)
CD4 Count: 534
CD4 Count: 625
CD4%: 29
CD4%: 32.9
CD8 % Suppressor T Cell: 51.3
CD8 % Suppressor T Cell: 57.5
CD8: 1058
CD8: 976

## 2022-01-24 LAB — HIV RNA, QUANTITATIVE, PCR
HIV 1 RNA Quant: 10800
HIV 1 RNA Quant: 21100

## 2022-02-02 ENCOUNTER — Other Ambulatory Visit: Payer: Self-pay

## 2022-02-02 ENCOUNTER — Encounter (INDEPENDENT_AMBULATORY_CARE_PROVIDER_SITE_OTHER): Payer: Self-pay | Admitting: *Deleted

## 2022-02-02 VITALS — BP 122/80 | HR 65 | Temp 97.8°F | Wt 180.2 lb

## 2022-02-02 DIAGNOSIS — Z006 Encounter for examination for normal comparison and control in clinical research program: Secondary | ICD-10-CM

## 2022-02-02 MED ORDER — STUDY - MK-8591A-053 - MK-8591A 100/0.25 MG OR PLACEBO TABLET (PI-VAN DAM)
ORAL_TABLET | ORAL | 0 refills | Status: DC
Start: 2022-02-02 — End: 2022-03-06

## 2022-02-02 MED ORDER — STUDY - MK-8591A-053 - BICTEGRAVIR/EMTRICITABINE/TENOFOVIR ALAFENAMIDE (BIKTARVY) 50-200-25 MG OR PLACEBO TABLET (PI-VAN DAM): ORAL_TABLET | ORAL | 0 refills | Status: DC

## 2022-02-02 NOTE — Research (Signed)
[name] here today for her week 4 visit for the Merck [phone]) study. No new complaints. Doing much better with her asthma symptoms. Bilateral breath sound clear to auscultation. She verbalized excellent adherence with her study medicines. She did miss one dose otherwise no issues. She did tell me that she had been keeping her medicine in the trunk of her car. States that she is trying to keep her medicine out of sight from family and friends. Explained to her that she needs to find another location for her medication. Explained how the temperature could impact the efficacy of the medication. I provided her with a pill box and told her to find a cool place in her home to hide her bottles. Verbalized understanding. She will return in July for her next study visit.

## 2022-03-06 ENCOUNTER — Other Ambulatory Visit: Payer: Self-pay

## 2022-03-06 ENCOUNTER — Encounter (INDEPENDENT_AMBULATORY_CARE_PROVIDER_SITE_OTHER): Payer: Self-pay | Admitting: *Deleted

## 2022-03-06 VITALS — BP 120/81 | HR 73 | Temp 98.1°F | Wt 179.1 lb

## 2022-03-06 DIAGNOSIS — B20 Human immunodeficiency virus [HIV] disease: Secondary | ICD-10-CM

## 2022-03-06 DIAGNOSIS — Z006 Encounter for examination for normal comparison and control in clinical research program: Secondary | ICD-10-CM

## 2022-03-06 MED ORDER — STUDY - MK-8591A-053 - BICTEGRAVIR/EMTRICITABINE/TENOFOVIR ALAFENAMIDE (BIKTARVY) 50-200-25 MG OR PLACEBO TABLET (PI-VAN DAM): ORAL_TABLET | ORAL | 0 refills | Status: DC

## 2022-03-06 MED ORDER — STUDY - MK-8591A-053 - MK-8591A 100/0.25 MG OR PLACEBO TABLET (PI-VAN DAM): ORAL_TABLET | ORAL | 0 refills | Status: DC

## 2022-03-06 NOTE — Research (Signed)
[name] here for her week 8 [id number] study visit. States that she has noticed some fatigue in the last week. Will monitor and notify us of any changes. States that she also had a molar on the bottom (L) side to "fallout" and she has broke a tooth off while eating ice on the (R) upper side. Had her complete the dental form at [facility] so that we can get her in to the dental clinic to be seen/treated as needed. 100% adherence with her study medication by pill count. No new medications. She is scheduled to see Dr. Daiva Eves next month and will return in September for her next study visit.

## 2022-03-07 ENCOUNTER — Encounter: Payer: Self-pay | Admitting: Infectious Disease

## 2022-03-28 ENCOUNTER — Ambulatory Visit: Payer: Self-pay | Admitting: Infectious Disease

## 2022-04-03 ENCOUNTER — Ambulatory Visit: Payer: Self-pay | Admitting: Family

## 2022-04-03 ENCOUNTER — Ambulatory Visit: Payer: Self-pay | Admitting: Infectious Disease

## 2022-04-03 ENCOUNTER — Other Ambulatory Visit: Payer: Self-pay

## 2022-04-03 ENCOUNTER — Telehealth: Payer: Self-pay | Admitting: Family

## 2022-04-16 ENCOUNTER — Other Ambulatory Visit (HOSPITAL_COMMUNITY): Payer: Self-pay

## 2022-05-03 ENCOUNTER — Other Ambulatory Visit (HOSPITAL_COMMUNITY): Payer: Self-pay

## 2022-05-03 ENCOUNTER — Other Ambulatory Visit: Payer: Self-pay

## 2022-05-03 ENCOUNTER — Encounter (INDEPENDENT_AMBULATORY_CARE_PROVIDER_SITE_OTHER): Payer: Self-pay

## 2022-05-03 VITALS — BP 117/77 | HR 80 | Temp 98.2°F | Resp 16 | Wt 176.6 lb

## 2022-05-03 DIAGNOSIS — Z006 Encounter for examination for normal comparison and control in clinical research program: Secondary | ICD-10-CM

## 2022-05-03 MED ORDER — STUDY - MK-8591A-053 - MK-8591A 100/0.25 MG OR PLACEBO TABLET (PI-VAN DAM): ORAL_TABLET | ORAL | 0 refills | Status: DC

## 2022-05-03 MED ORDER — STUDY - MK-8591A-053 - BICTEGRAVIR/EMTRICITABINE/TENOFOVIR ALAFENAMIDE (BIKTARVY) 50-200-25 MG OR PLACEBO TABLET (PI-VAN DAM): ORAL_TABLET | ORAL | 0 refills | Status: DC

## 2022-05-03 NOTE — Research (Signed)
Clarence Simpson seen for Week 16 [id number] study visit. [name] is a Phase 3, Randomized, Active-Controlled, Double-Blind Clinical Study to Evaluate the Antiretroviral Activity, Safety, and Tolerability of Doravirine/Islatravir (DOR/ISL 100 mg/0.25 mg) Once-Daily in HIV-1 Infected Treatment-Nave Participants. For patient care concerns please call [phone]. All procedures completed per protocol. Study medications provided. She reports some feelings of depression related to life stressors (employment, living situation). Discussed counseling and supports groups as options for participant, declines these services at this time.  She states she will make an appointment with Dr. Daiva Simpson in the [facility], contact information provided. Plan to see Clarence Simpson again in research in October.

## 2022-05-27 ENCOUNTER — Emergency Department (HOSPITAL_COMMUNITY): Payer: Self-pay

## 2022-05-27 ENCOUNTER — Encounter (HOSPITAL_COMMUNITY): Payer: Self-pay

## 2022-05-27 ENCOUNTER — Emergency Department (HOSPITAL_COMMUNITY)
Admission: EM | Admit: 2022-05-27 | Discharge: 2022-05-27 | Disposition: A | Discharge status: Home / Self Care | Payer: Self-pay | Attending: Emergency Medicine | Admitting: Emergency Medicine

## 2022-05-27 DIAGNOSIS — R519 Headache, unspecified: Secondary | ICD-10-CM | POA: Diagnosis not present

## 2022-05-27 DIAGNOSIS — R6884 Jaw pain: Secondary | ICD-10-CM | POA: Insufficient documentation

## 2022-05-27 DIAGNOSIS — R202 Paresthesia of skin: Secondary | ICD-10-CM | POA: Diagnosis not present

## 2022-05-27 DIAGNOSIS — S161XXA Strain of muscle, fascia and tendon at neck level, initial encounter: Secondary | ICD-10-CM

## 2022-05-27 DIAGNOSIS — J3489 Other specified disorders of nose and nasal sinuses: Secondary | ICD-10-CM | POA: Diagnosis not present

## 2022-05-27 DIAGNOSIS — M25511 Pain in right shoulder: Secondary | ICD-10-CM | POA: Diagnosis not present

## 2022-05-27 DIAGNOSIS — S199XXA Unspecified injury of neck, initial encounter: Secondary | ICD-10-CM | POA: Diagnosis present

## 2022-05-27 DIAGNOSIS — Y92410 Unspecified street and highway as the place of occurrence of the external cause: Secondary | ICD-10-CM | POA: Diagnosis not present

## 2022-05-27 MED ORDER — IBUPROFEN 200 MG PO TABS
600.0000 mg | ORAL_TABLET | Freq: Once | ORAL | Status: DC
  Filled 2022-05-27: qty 3

## 2022-05-27 MED ORDER — IBUPROFEN 800 MG PO TABS
800.0000 mg | ORAL_TABLET | Freq: Once | ORAL | Status: AC
  Administered 2022-05-27: 800 mg via ORAL
  Filled 2022-05-27: qty 1

## 2022-05-27 MED ORDER — METHOCARBAMOL 500 MG PO TABS: 500.0000 mg | ORAL_TABLET | Freq: Three times a day (TID) | ORAL | 0 refills | Status: DC | PRN

## 2022-05-27 MED ORDER — NAPROXEN 500 MG PO TABS: 500.0000 mg | ORAL_TABLET | Freq: Two times a day (BID) | ORAL | 0 refills | Status: DC | PRN

## 2022-05-27 NOTE — Discharge Instructions (Signed)
Your x-rays and CT scans are reassuring.  You have a possible nose fracture but this is not definite.  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  Follow-up with your primary doctor.  Return to the ED with worsening pain, weakness, numbness, tingling, difficulty breathing or other concerns.
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
## 2022-06-21 ENCOUNTER — Other Ambulatory Visit: Payer: Self-pay

## 2022-06-21 ENCOUNTER — Encounter (INDEPENDENT_AMBULATORY_CARE_PROVIDER_SITE_OTHER): Payer: Self-pay | Admitting: *Deleted

## 2022-06-21 DIAGNOSIS — Z006 Encounter for examination for normal comparison and control in clinical research program: Secondary | ICD-10-CM

## 2022-06-21 MED ORDER — STUDY - MK-8591A-053 - BICTEGRAVIR/EMTRICITABINE/TENOFOVIR ALAFENAMIDE (BIKTARVY) 50-200-25 MG OR PLACEBO TABLET (PI-VAN DAM): ORAL_TABLET | ORAL | 0 refills | Status: DC

## 2022-06-21 MED ORDER — STUDY - MK-8591A-053 - MK-8591A 100/0.25 MG OR PLACEBO TABLET (PI-VAN DAM): ORAL_TABLET | ORAL | 0 refills | Status: DC
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
## 2022-06-26 NOTE — Progress Notes (Deleted)
Subjective:  Chief Complaint: follwoup for HIV disease on medications  Patient ID: Clarence Simpson, adult    DOB: [birth year], 36 y.o.   MRN: [id number]  HPI  Clarence Simpson is a 36 year old Black woman with newly diagnosed HIV with wild type virus and no hepatitis b infection, presents for follow-up.  He is about to enroll in the [employer] study comparing Islatravir/Doravirine vs [employer].  She had schedule clinic today but is having trouble with asthma flares that she is having on a weekly basis.  She had been using an albuterol metered-dose inhaler that she had borrowed from a friend that was dated as having expired in 2013.  She also has an area of what appears to be resolving folliculitis along top of pubic hair.  She has been suffering from depressive symptoms since being diagnosed with HIV and is open to a phone or video visit with Okey Regal.    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   Asthma    daily use of inhaler   Finger laceration 12/13/2021   Folliculitis 01/10/2022   Headache(784.0)    migraines   HIV disease (HCC) 12/13/2021    Past Surgical History:  Procedure Laterality Date   LEFT HEART CATHETERIZATION WITH CORONARY ANGIOGRAM N/A 09/02/2014   Procedure: LEFT HEART CATHETERIZATION WITH CORONARY ANGIOGRAM;  Surgeon: Kathleene Hazel, MD;  Location: [facility] CATH LAB;  Service: Cardiovascular;  Laterality: N/A;   NO PAST SURGERIES     WART FULGURATION N/A 10/30/2012   Procedure: Fulgeration  Anal condylomata, Rectal exam under anesthesia;  Surgeon: Velora Heckler, MD;  Location: [facility];  Service: General;  Laterality: N/A;    Family History  Problem Relation Age of Onset   Heart failure Father       Social History   Socioeconomic History   Marital status: Single    Spouse name: Not on file   Number of children: Not on file   Years of education: Not on file   Highest education level: Not on file  Occupational History   Not  on file  Tobacco Use   Smoking status: Every Day    Types: Cigars   Smokeless tobacco: Never   Tobacco comments:    smokes Black and Milds - 1/day  Vaping Use   Vaping Use: Never used  Substance and Sexual Activity   Alcohol use: Yes    Alcohol/week: 1.0 standard drink of alcohol    Types: 1 Standard drinks or equivalent per week    Comment: occasionally   Drug use: No   Sexual activity: Not on file  Other Topics Concern   Not on file  Social History Narrative   Not on file   Social Determinants of Health   Financial Resource Strain: Not on file  Food Insecurity: Not on file  Transportation Needs: Not on file  Physical Activity: Not on file  Stress: Not on file  Social Connections: Not on file    No Known Allergies   Current Outpatient Medications:    albuterol (VENTOLIN HFA) 108 (90 Base) MCG/ACT inhaler, Inhale 2 puffs into the lungs every 6 (six) hours as needed for wheezing or shortness of breath., Disp: 8 g, Rfl: 4   methocarbamol (ROBAXIN) 500 MG tablet, Take 1 tablet (500 mg total) by mouth every 8 (eight) hours as needed for muscle spasms., Disp: 20 tablet, Rfl: 0   naproxen (NAPROSYN) 500 MG tablet, Take 1 tablet (500 mg  total) by mouth 2 (two) times daily as needed., Disp: 30 tablet, Rfl: 0   Study - [id number] - bictegravir/emtrictabine/tenofovir alafenamide (BIKTARVY) 50-200-25 mg or placebo tablet ([facility]), For Investigational Drug Use Only. Take 1 tablet my mouth daily with water. Take at the same time every day. Please bring back all bottles to study coordinator at your next visit. Please contact [facility] regarding any question about this medication., Disp: 105 tablet, Rfl: 0   Study - [id number] - [id number] 100/0.25 mg or placebo tablet ([facility]), For Investigational Drug Use Only. Take 1 tablet my mouth daily with water. Take at the same time every day. Please bring back all bottles to study coordinator at your next visit. Please contact [facility] regarding any question about this medication., Disp: 105 tablet, Rfl: 0   Review of Systems  Constitutional:  Negative for activity change, appetite change, chills, diaphoresis, fatigue, fever and unexpected weight change.  HENT:  Negative for congestion, rhinorrhea, sinus pressure, sneezing, sore throat and trouble swallowing.   Eyes:  Negative for photophobia and visual disturbance.  Respiratory:  Positive for shortness of breath and wheezing. Negative for cough, chest tightness and stridor.   Cardiovascular:  Negative for chest pain, palpitations and leg swelling.  Gastrointestinal:  Negative for abdominal distention, abdominal pain, anal bleeding, blood in stool, constipation, diarrhea, nausea and vomiting.  Genitourinary:  Negative for difficulty urinating, dysuria, flank pain and hematuria.  Musculoskeletal:  Negative for arthralgias, back pain, gait problem, joint swelling and myalgias.  Skin:  Negative for color change, pallor, rash and wound.  Neurological:  Negative for dizziness, tremors, weakness and light-headedness.  Hematological:  Negative for adenopathy. Does not bruise/bleed easily.  Psychiatric/Behavioral:  Positive for dysphoric mood. Negative for agitation, behavioral problems, confusion, decreased concentration and sleep disturbance.        Objective:   Physical Exam Constitutional:      Appearance: She is well-developed.  HENT:     Head: Normocephalic and atraumatic.  Eyes:     Conjunctiva/sclera: Conjunctivae normal.  Cardiovascular:     Rate and Rhythm: Normal rate and regular rhythm.  Pulmonary:     Effort: Pulmonary effort is normal. No respiratory distress.     Breath sounds: No stridor. No wheezing or rhonchi.  Abdominal:     General: There is no distension.     Palpations: Abdomen is soft.  Genitourinary:    Comments: Two areas of what appear to be resolving follicultlis Musculoskeletal:        General: No tenderness. Normal range of  motion.     Cervical back: Normal range of motion and neck supple.  Skin:    General: Skin is warm and dry.     Coloration: Skin is not pale.     Findings: No erythema or rash.  Neurological:     General: No focal deficit present.     Mental Status: She is alert and oriented to person, place, and time.  Psychiatric:        Mood and Affect: Mood normal.        Behavior: Behavior normal.        Thought Content: Thought content normal.        Judgment: Judgment normal.           Assessment & Plan:   Asthma: I have written prescription for albuterol and sent to [facility] on [address] [place].  We will endeavor to get pharmaceutical assistance for an inhaled corticosteroid which she clearly needs  to help reduce baseline inflammation.  I also called in a prescription for prednisone 40 mg times a week that I have also sent to the [facility] in [place] since she currently not have access to inhaled steroid and asthma flare was fairly significant in the last week.  She currently does not appear in any acute distress but says that she took some of the albuterol that she had shortly before coming into clinic  Depression: will have her meet with Arbie Cookey and I am also happy to rx an SSRI  HIV disease: We will start meds through the study tomorrow and I think she will feel a lot better as her virus comes under control which I would expect to happen within the first month.  Folliculitis: Seems resolving but if recurs again would give doxycycline and also consider decolonization regimen.   Transgender health: if allowed to co-enroll into ACTG study would also consider this for her

## 2022-06-27 ENCOUNTER — Ambulatory Visit: Payer: Self-pay | Admitting: Infectious Disease

## 2022-06-27 ENCOUNTER — Telehealth: Payer: Self-pay

## 2022-06-27 DIAGNOSIS — Z720 Tobacco use: Secondary | ICD-10-CM

## 2022-06-27 DIAGNOSIS — A630 Anogenital (venereal) warts: Secondary | ICD-10-CM

## 2022-06-27 DIAGNOSIS — B20 Human immunodeficiency virus [HIV] disease: Secondary | ICD-10-CM

## 2022-06-27 NOTE — Telephone Encounter (Signed)
Called patient to reschedule missed appointment. No answer. Phone went directly to busy signal. Unable to leave a message.  Binnie Kand, RN

## 2022-06-27 NOTE — Telephone Encounter (Signed)
Attempted to call patient again due to problem with office phone. No answer. Left HIPAA-compliant voicemail requesting call back.   Briany Aye E Eulla Kochanowski, RN  

## 2022-09-19 ENCOUNTER — Encounter (INDEPENDENT_AMBULATORY_CARE_PROVIDER_SITE_OTHER): Payer: Self-pay | Admitting: *Deleted

## 2022-09-19 ENCOUNTER — Other Ambulatory Visit: Payer: Self-pay

## 2022-09-19 DIAGNOSIS — Z006 Encounter for examination for normal comparison and control in clinical research program: Secondary | ICD-10-CM

## 2022-09-19 MED ORDER — STUDY - MK-8591A-053 - BICTEGRAVIR/EMTRICITABINE/TENOFOVIR ALAFENAMIDE (BIKTARVY) 50-200-25 MG OR PLACEBO TABLET (PI-VAN DAM): ORAL_TABLET | ORAL | 0 refills | Status: DC

## 2022-09-19 MED ORDER — STUDY - MK-8591A-053 - MK-8591A 100/0.25 MG OR PLACEBO TABLET (PI-VAN DAM): ORAL_TABLET | ORAL | 0 refills | Status: DC

## 2022-09-19 NOTE — Research (Signed)
Clarence Simpson seen today for her week 70 visit for [id number] study, A Phase 3, Randomized, Active-Controlled, Double-Blind Clinical Study to Evaluate the Antiretroviral Activity, Safety, and Tolerability of Doravirine/Islatravir (DOR/ISL 100 mg/0.25 mg) Once-Daily in HIV-1 Infected Treatment-Nave Participants. Excellent adherence with her study medication, 100% by pill count. No new complaints or medications. She will return in April for her next study visit and is scheduled to see Dr. Tommy Medal at that time as well.

## 2022-12-10 ENCOUNTER — Emergency Department (HOSPITAL_COMMUNITY)
Admission: EM | Admit: 2022-12-10 | Discharge: 2022-12-10 | Disposition: A | Discharge status: Home / Self Care | Payer: Medicaid Other | Attending: Emergency Medicine | Admitting: Emergency Medicine

## 2022-12-10 ENCOUNTER — Other Ambulatory Visit: Payer: Self-pay

## 2022-12-10 ENCOUNTER — Telehealth (HOSPITAL_COMMUNITY): Payer: Self-pay | Admitting: Emergency Medicine

## 2022-12-10 DIAGNOSIS — R0981 Nasal congestion: Secondary | ICD-10-CM | POA: Diagnosis not present

## 2022-12-10 DIAGNOSIS — K0889 Other specified disorders of teeth and supporting structures: Secondary | ICD-10-CM | POA: Insufficient documentation

## 2022-12-10 MED ORDER — ACETAMINOPHEN 500 MG PO TABS
1000.0000 mg | ORAL_TABLET | Freq: Once | ORAL | Status: AC
  Administered 2022-12-10: 1000 mg via ORAL
  Filled 2022-12-10: qty 2

## 2022-12-10 MED ORDER — LIDOCAINE VISCOUS HCL 2 % MT SOLN: 15.0000 mL | Freq: Four times a day (QID) | OROMUCOSAL | 0 refills | Status: DC | PRN

## 2022-12-10 MED ORDER — CHLORHEXIDINE GLUCONATE 0.12 % MT SOLN: 15.0000 mL | Freq: Two times a day (BID) | OROMUCOSAL | 0 refills | Status: DC

## 2022-12-10 MED ORDER — CETIRIZINE HCL 10 MG PO TABS: 10.0000 mg | ORAL_TABLET | Freq: Every day | ORAL | 0 refills | Status: DC

## 2022-12-10 MED ORDER — PENICILLIN V POTASSIUM 500 MG PO TABS: 500.0000 mg | ORAL_TABLET | Freq: Three times a day (TID) | ORAL | 0 refills | Status: AC

## 2022-12-10 MED ORDER — FLUTICASONE PROPIONATE 50 MCG/ACT NA SUSP: 2.0000 | Freq: Every day | NASAL | 0 refills | Status: DC

## 2022-12-10 NOTE — Discharge Instructions (Addendum)
I recommend using Zyrtec 10 mg once daily, fluticasone 2 sprays into each nostril twice daily as discussed and demonstrated. Use sinus rinses (netti pot) to irrigate sinuses.   Tylenol 1000 mg every 6 hours  I have written you prescription for Peridex mouth rinse (use at bedtime) and penicillin an antibiotic used to treat dental infections.  I do not see any evidence of a dental infection but I do see eroded teeth that will need to be evaluated by dentist.  I have given you this patient for a dentist who can call let them know you were seen in the emergency room and instructed to follow-up.  I have also given you the information for the [facility] please follow-up with them  I have also given you a prescription for what is called Magic mouthwash which is a solution of lidocaine and some other medications that will help numb up your mouth.  Swish and spit up to 4 times daily as needed.

## 2022-12-10 NOTE — ED Triage Notes (Signed)
Pt reporting a cut on the roof of the mouth that is causing burning. Pt also having dental pain that has been ongoing, but worse today. Has been taking Excedrin for the pain

## 2022-12-10 NOTE — ED Provider Notes (Signed)
[place] EMERGENCY DEPARTMENT AT [facility] Provider Note   CSN: [id number] Arrival date & time: 12/10/22  3329     History {Add pertinent medical, surgical, social history, OB history to HPI:1} Chief Complaint  Patient presents with   Dental Pain    Clarence Simpson is a 37 y.o. adult.   Dental Pain        Home Medications Prior to Admission medications   Medication Sig Start Date End Date Taking? Authorizing Provider  albuterol (VENTOLIN HFA) 108 (90 Base) MCG/ACT inhaler Inhale 2 puffs into the lungs every 6 (six) hours as needed for wheezing or shortness of breath. 01/10/22   Randall Hiss, MD  methocarbamol (ROBAXIN) 500 MG tablet Take 1 tablet (500 mg total) by mouth every 8 (eight) hours as needed for muscle spasms. 05/27/22   Rancour, Jeannett Senior, MD  naproxen (NAPROSYN) 500 MG tablet Take 1 tablet (500 mg total) by mouth 2 (two) times daily as needed. 05/27/22   Glynn Octave, MD  Study [phone] - bictegravir/emtrictabine/tenofovir alafenamide (BIKTARVY) 50-200-25 mg or placebo tablet ([facility]) For Investigational Drug Use Only. Take 1 tablet my mouth daily with water. Take at the same time every day. Please bring back all bottles to study coordinator at your next visit. Please contact [facility] regarding any question about this medication. 09/19/22   Judeth Cornfield  Study - [id number] - [id number] 100/0.25 mg or placebo tablet [facility]) For Investigational Drug Use Only. Take 1 tablet my mouth daily with water. Take at the same time every day. Please bring back all bottles to study coordinator at your next visit. Please contact [facility] regarding any question about this medication. 09/19/22   Horton Finer, PA-C  loratadine (CLARITIN) 10 MG tablet Take 10 mg by mouth daily.  08/24/20  [provider]      Allergies    Patient has no known allergies.    Review of Systems   Review of Systems  Physical  Exam Updated Vital Signs BP (!) 173/81 (BP Location: Right Arm)   Pulse 88   Temp 98.5 F (36.9 C) (Oral)   Resp 18   Ht 6' (1.829 m)   Wt 81.6 kg   SpO2 100%   BMI 24.41 kg/m  Physical Exam  ED Results / Procedures / Treatments   Labs (all labs ordered are listed, but only abnormal results are displayed) Labs Reviewed - No data to display  EKG None  Radiology No results found.  Procedures Procedures  {Document cardiac monitor, telemetry assessment procedure when appropriate:1}  Medications Ordered in ED Medications - No data to display  ED Course/ Medical Decision Making/ A&P   {   Click here for ABCD2, HEART and other calculatorsREFRESH Note before signing :1}                          Medical Decision Making  ***  {Document critical care time when appropriate:1} {Document review of labs and clinical decision tools ie heart score, Chads2Vasc2 etc:1}  {Document your independent review of radiology images, and any outside records:1} {Document your discussion with family members, caretakers, and with consultants:1} {Document social determinants of health affecting pt's care:1} {Document your decision making why or why not admission, treatments were needed:1} Final Clinical Impression(s) / ED Diagnoses Final diagnoses:  None    Rx / DC Orders ED Discharge Orders     None

## 2022-12-10 NOTE — Telephone Encounter (Signed)
Patient called stating that the magic mouthwash prescription that was prescribed earlier is not available at the [facility].  Patient is requesting this [facility].    [facility] on [address] per patient's request.

## 2022-12-11 ENCOUNTER — Encounter (HOSPITAL_COMMUNITY): Payer: Self-pay

## 2022-12-11 ENCOUNTER — Emergency Department (HOSPITAL_COMMUNITY)
Admission: EM | Admit: 2022-12-11 | Discharge: 2022-12-11 | Disposition: A | Discharge status: Home / Self Care | Payer: Medicaid Other | Attending: Emergency Medicine | Admitting: Emergency Medicine

## 2022-12-11 ENCOUNTER — Emergency Department (HOSPITAL_COMMUNITY): Payer: Medicaid Other

## 2022-12-11 ENCOUNTER — Other Ambulatory Visit: Payer: Self-pay

## 2022-12-11 DIAGNOSIS — J45909 Unspecified asthma, uncomplicated: Secondary | ICD-10-CM | POA: Insufficient documentation

## 2022-12-11 DIAGNOSIS — R519 Headache, unspecified: Secondary | ICD-10-CM | POA: Diagnosis present

## 2022-12-11 DIAGNOSIS — J029 Acute pharyngitis, unspecified: Secondary | ICD-10-CM | POA: Insufficient documentation

## 2022-12-11 DIAGNOSIS — Z7951 Long term (current) use of inhaled steroids: Secondary | ICD-10-CM | POA: Insufficient documentation

## 2022-12-11 DIAGNOSIS — Z21 Asymptomatic human immunodeficiency virus [HIV] infection status: Secondary | ICD-10-CM | POA: Insufficient documentation

## 2022-12-11 LAB — CBC
HCT: 47.7 % (ref 39.0–52.0)
Hemoglobin: 15.7 g/dL (ref 13.0–17.0)
MCH: 28.7 pg (ref 26.0–34.0)
MCHC: 32.9 g/dL (ref 30.0–36.0)
MCV: 87.2 fL (ref 80.0–100.0)
Platelets: 242 10*3/uL (ref 150–400)
RBC: 5.47 MIL/uL (ref 4.22–5.81)
RDW: 13 % (ref 11.5–15.5)
WBC: 5 10*3/uL (ref 4.0–10.5)
nRBC: 0 % (ref 0.0–0.2)

## 2022-12-11 LAB — BASIC METABOLIC PANEL
Anion gap: 8 (ref 5–15)
BUN: 10 mg/dL (ref 6–20)
CO2: 23 mmol/L (ref 22–32)
Calcium: 9 mg/dL (ref 8.9–10.3)
Chloride: 104 mmol/L (ref 98–111)
Creatinine, Ser: 1.19 mg/dL (ref 0.61–1.24)
GFR, Estimated: >60 mL/min (ref >60)
Glucose, Bld: 100 mg/dL — ABNORMAL HIGH (ref 70–99)
Potassium: 3.6 mmol/L (ref 3.5–5.1)
Sodium: 135 mmol/L (ref 135–145)

## 2022-12-11 MED ORDER — LIDOCAINE VISCOUS HCL 2 % MT SOLN
15.0000 mL | Freq: Once | OROMUCOSAL | Status: AC
  Administered 2022-12-11: 15 mL via OROMUCOSAL
  Filled 2022-12-11: qty 15

## 2022-12-11 MED ORDER — KETOROLAC TROMETHAMINE 15 MG/ML IJ SOLN
15.0000 mg | Freq: Once | INTRAMUSCULAR | Status: AC
  Administered 2022-12-11: 15 mg via INTRAMUSCULAR
  Filled 2022-12-11: qty 1

## 2022-12-11 MED ORDER — DEXAMETHASONE 4 MG PO TABS
6.0000 mg | ORAL_TABLET | Freq: Once | ORAL | Status: AC
  Administered 2022-12-11: 6 mg via ORAL
  Filled 2022-12-11: qty 2

## 2022-12-11 MED ORDER — PROCHLORPERAZINE EDISYLATE 10 MG/2ML IJ SOLN
5.0000 mg | Freq: Once | INTRAMUSCULAR | Status: AC
  Administered 2022-12-11: 5 mg via INTRAMUSCULAR
  Filled 2022-12-11: qty 2

## 2022-12-11 NOTE — ED Provider Triage Note (Signed)
  Emergency Medicine Provider Triage Evaluation Note  MRN:  [id number]  Arrival date & time: 12/11/22    Medically screening exam initiated at 1:10 AM.   CC:   Loss of Consciousness   HPI:  Clarence Simpson is a 37 y.o. year-old adult presents to the ED with chief complaint of headache/migraine.  States he also has dental pain.  States the pain caused him to pass out.  History provided by patient. ROS:  -As included in HPI PE:   Vitals:   12/11/22 0025  BP: (!) 148/92  Pulse: 64  Resp: 17  Temp: 98.6 F (37 C)  SpO2: 99%    Non-toxic appearing No respiratory distress  MDM:  Headache.  Hx of HIV.  Will check labs and imaging due to worsening symptoms. I've ordered labs and imaging in triage to expedite lab/diagnostic workup.  Patient was informed that the remainder of the evaluation will be completed by another provider, this initial triage assessment does not replace that evaluation, and the importance of remaining in the ED until their evaluation is complete.    Roxy Horseman, PA-C 12/11/22 [phone]

## 2022-12-11 NOTE — ED Provider Notes (Signed)
[place] EMERGENCY DEPARTMENT AT [facility] Provider Note   CSN: [id number] Arrival date & time: 12/11/22  0019     History  Chief Complaint  Patient presents with   Loss of Consciousness    Clarence Simpson is a 37 y.o. adult.   Loss of Consciousness Patient presents with headache sore throat syncope dental pain.  Had been seen yesterday at [facility] for sore throat and feeling bad.  Appears to have been given penicillin and Magic mouthwash.  States her headache was not addressed.  States headache in the frontal head.  States it does hurt with swallowing she has not been able to eat.  No fevers.  Did have an episode where she also passed out.  No numbness or weakness.  No definite sick contacts.    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 Authorizing Provider  albuterol (VENTOLIN HFA) 108 (90 Base) MCG/ACT inhaler Inhale 2 puffs into the lungs every 6 (six) hours as needed for wheezing or shortness of breath. 01/10/22   Randall Hiss, MD  cetirizine (ZYRTEC ALLERGY) 10 MG tablet Take 1 tablet (10 mg total) by mouth daily for 21 days. 12/10/22 12/31/22  Gailen Shelter, PA  chlorhexidine (PERIDEX) 0.12 % solution Use as directed 15 mLs in the mouth or throat 2 (two) times daily. 12/10/22   Fondaw, Rodrigo Ran, PA  fluticasone (FLONASE) 50 MCG/ACT nasal spray Place 2 sprays into both nostrils daily for 14 days. 12/10/22 12/24/22  Gailen Shelter, PA  magic mouthwash (lidocaine, diphenhydrAMINE, alum & mag hydroxide) suspension Swish and spit 15 mLs 4 (four) times daily as needed for mouth pain. 12/10/22   Clark, Meghan R, PA-C  methocarbamol (ROBAXIN) 500 MG tablet Take 1 tablet (500 mg total) by mouth every 8  (eight) hours as needed for muscle spasms. 05/27/22   Rancour, Jeannett Senior, MD  naproxen (NAPROSYN) 500 MG tablet Take 1 tablet (500 mg total) by mouth 2 (two) times daily as needed. 05/27/22   Rancour, Jeannett Senior, MD  penicillin v potassium (VEETID) 500 MG tablet Take 1 tablet (500 mg total) by mouth 3 (three) times daily for 7 days. 12/10/22 12/17/22  Gailen Shelter, PA  Study [phone] - bictegravir/emtrictabine/tenofovir alafenamide (BIKTARVY) 50-200-25 mg or placebo tablet ([facility]) For Investigational Drug Use Only. Take 1 tablet my mouth daily with water. Take at the same time every day. Please bring back all bottles to study coordinator at your next visit. Please contact [facility] regarding any question about this medication. 09/19/22   Judeth Cornfield  Study - [id number] - [id number] 100/0.25 mg or placebo tablet [facility]) For Investigational Drug Use Only. Take 1 tablet my mouth daily with water. Take at the same time every day. Please bring back all bottles to study coordinator at your next visit. Please contact [facility] regarding any question about this medication. 09/19/22   Horton Finer, PA-C  loratadine (CLARITIN) 10 MG tablet Take 10 mg by mouth daily.  08/24/20  [provider]      Allergies    Patient has no known allergies.    Review of Systems   Review of Systems  Cardiovascular:  Positive for syncope.    Physical Exam Updated Vital Signs BP 109/61   Pulse (!) 55   Temp 97.9 F (36.6 C) (Oral)   Resp 16   Ht 6' (1.829 m)   Wt 81.6 kg   SpO2 100%   BMI 24.41 kg/m  Physical Exam Vitals and nursing note reviewed.  HENT:     Head: Atraumatic.     Mouth/Throat:     Pharynx: No oropharyngeal exudate or posterior oropharyngeal erythema.  Cardiovascular:     Rate and Rhythm: Regular rhythm.  Pulmonary:     Breath sounds: No wheezing or rhonchi.  Abdominal:     Tenderness: There is no abdominal tenderness.  Musculoskeletal:         General: No tenderness.  Skin:    Capillary Refill: Capillary refill takes less than 2 seconds.  Neurological:     Mental Status: She is alert.     ED Results / Procedures / Treatments   Labs (all labs ordered are listed, but only abnormal results are displayed) Labs Reviewed  BASIC METABOLIC PANEL - Abnormal; Notable for the following components:      Result Value   Glucose, Bld 100 (*)    All other components within normal limits  CBC    EKG EKG Interpretation  Date/Time:  Tuesday December 11 2022 01:03:33 EDT Ventricular Rate:  65 PR Interval:  138 QRS Duration: 82 QT Interval:  398 QTC Calculation: 413 R Axis:   85 Text Interpretation: Normal sinus rhythm Nonspecific ST and T wave abnormality Abnormal ECG When compared with ECG of 24-Aug-2020 15:04, No significant change was found Confirmed by Dione Booze ([id number]) on 12/11/2022 5:18:07 AM  Radiology CT HEAD WO CONTRAST ( )  Result Date: 12/11/2022 CLINICAL DATA:  Sudden severe headache EXAM: CT HEAD WITHOUT CONTRAST TECHNIQUE: Contiguous axial images were obtained from the base of the skull through the vertex without intravenous contrast. RADIATION DOSE REDUCTION: This exam was performed according to the departmental dose-optimization program which includes automated exposure control, adjustment of the mA and/or kV according to patient size and/or use of iterative reconstruction technique. COMPARISON:  None Available. FINDINGS: Brain: There is no mass, hemorrhage or extra-axial collection. The size and configuration of the ventricles and extra-axial CSF spaces are normal. The brain parenchyma is normal, without acute or chronic infarction. Vascular: No abnormal hyperdensity of the major intracranial arteries or dural venous sinuses. No intracranial atherosclerosis. Skull: The visualized skull base, calvarium and extracranial soft tissues are normal. Sinuses/Orbits: No fluid levels or advanced mucosal thickening of the visualized  paranasal sinuses. No mastoid or middle ear effusion. The orbits are normal. IMPRESSION: Normal head CT. Electronically Signed   By: Deatra Robinson M.D.   On: 12/11/2022 02:31    Procedures Procedures    Medications Ordered in ED Medications  ketorolac (TORADOL) 15 MG/ML injection 15 mg (15 mg Intramuscular Given 12/11/22 0810)  prochlorperazine (COMPAZINE) injection 5 mg (5 mg Intramuscular Given 12/11/22 0811)  dexamethasone (DECADRON) tablet 6 mg (6 mg Oral Given 12/11/22 0808)  lidocaine (XYLOCAINE) 2 % viscous mouth solution 15 mL (15 mLs Mouth/Throat Given 12/11/22 1025)    ED Course/ Medical Decision Making/ A&P                             Medical Decision Making Risk Prescription drug management.   Patient with sore throat myalgias decreased oral intake.  Also headache.  Recently seen at  [facility].  Started on antibiotics.  Had not had Magic mouthwash filled yet because it needed to come from different part of the pharmacy.  States had not been eating much and passed out.  Headache.  Reviewed head CT which is reassuring.  Nonfocal exam.  Posterior pharynx without erythema.  Doubt severe infection such as peritonsillar abscess.  Had already been prescribed penicillin to cover sinuses.  Will treat as potential migraine with Compazine and Toradol.  Feeling better after treatment.  Will discharge home.  Continue antibiotics given at last visit.        Final Clinical Impression(s) / ED Diagnoses Final diagnoses:  Nonintractable headache, unspecified chronicity pattern, unspecified headache type  Pharyngitis, unspecified etiology    Rx / DC Orders ED Discharge Orders     None         Benjiman Core, MD 12/11/22 1527

## 2022-12-11 NOTE — ED Triage Notes (Signed)
Pt was seen earlier at [facility] for multiple complaints. States her headache was not addressed. Pt went home and had syncopal episode. Pt would like to be seen for headache and syncope. Also endorses chest pain and SOB. Started today with [facility].

## 2022-12-13 ENCOUNTER — Other Ambulatory Visit: Payer: Self-pay

## 2022-12-13 ENCOUNTER — Ambulatory Visit (INDEPENDENT_AMBULATORY_CARE_PROVIDER_SITE_OTHER): Payer: Medicaid Other | Admitting: Infectious Disease

## 2022-12-13 ENCOUNTER — Encounter: Payer: Self-pay | Admitting: Infectious Disease

## 2022-12-13 ENCOUNTER — Other Ambulatory Visit: Payer: Self-pay | Admitting: Infectious Disease

## 2022-12-13 ENCOUNTER — Encounter (INDEPENDENT_AMBULATORY_CARE_PROVIDER_SITE_OTHER): Payer: Self-pay | Admitting: *Deleted

## 2022-12-13 VITALS — BP 143/83 | HR 77 | Temp 97.7°F | Ht 74.0 in | Wt 180.0 lb

## 2022-12-13 DIAGNOSIS — A630 Anogenital (venereal) warts: Secondary | ICD-10-CM | POA: Diagnosis not present

## 2022-12-13 DIAGNOSIS — B20 Human immunodeficiency virus [HIV] disease: Secondary | ICD-10-CM

## 2022-12-13 DIAGNOSIS — Z113 Encounter for screening for infections with a predominantly sexual mode of transmission: Secondary | ICD-10-CM

## 2022-12-13 DIAGNOSIS — R519 Headache, unspecified: Secondary | ICD-10-CM

## 2022-12-13 DIAGNOSIS — Z789 Other specified health status: Secondary | ICD-10-CM

## 2022-12-13 DIAGNOSIS — Z7185 Encounter for immunization safety counseling: Secondary | ICD-10-CM

## 2022-12-13 DIAGNOSIS — Z006 Encounter for examination for normal comparison and control in clinical research program: Secondary | ICD-10-CM

## 2022-12-13 HISTORY — DX: Headache, unspecified: R51.9

## 2022-12-13 HISTORY — DX: Encounter for immunization safety counseling: Z71.85

## 2022-12-13 HISTORY — DX: Encounter for screening for infections with a predominantly sexual mode of transmission: Z11.3

## 2022-12-13 MED ORDER — ESTRADIOL VALERATE 20 MG/ML IM OIL
5.0000 mg | TOPICAL_OIL | INTRAMUSCULAR | 1 refills | Status: DC
Start: 2022-12-13 — End: 2023-03-05
  Filled 2022-12-13: qty 5, 90d supply, fill #0

## 2022-12-13 MED ORDER — BD LUER-LOK SYRINGE 21G X 1" 3 ML MISC
2 refills | Status: DC
Start: 2022-12-13 — End: 2023-03-05
  Filled 2022-12-13: qty 5, 70d supply, fill #0

## 2022-12-13 MED ORDER — STUDY - MK-8591A-053 - MK-8591A 100/0.25 MG OR PLACEBO TABLET (PI-VAN DAM): ORAL_TABLET | ORAL | 0 refills | Status: DC

## 2022-12-13 MED ORDER — SPIRONOLACTONE 50 MG PO TABS
50.0000 mg | ORAL_TABLET | Freq: Every day | ORAL | 2 refills | Status: DC
  Filled 2022-12-13: qty 30, 30d supply, fill #0

## 2022-12-13 MED ORDER — ESTRADIOL VALERATE 20 MG/ML IM OIL
20.0000 mg | TOPICAL_OIL | INTRAMUSCULAR | 12 refills | Status: DC
  Filled 2022-12-13: qty 5, 140d supply, fill #0

## 2022-12-13 MED ORDER — STUDY - MK-8591A-053 - BICTEGRAVIR/EMTRICITABINE/TENOFOVIR ALAFENAMIDE (BIKTARVY) 50-200-25 MG OR PLACEBO TABLET (PI-VAN DAM): ORAL_TABLET | ORAL | 0 refills | Status: DC

## 2022-12-13 NOTE — Addendum Note (Signed)
Addended by: Linna Hoff D on: 12/13/2022 04:24 PM   Modules accepted: Orders

## 2022-12-13 NOTE — Addendum Note (Signed)
Addended by: Linna Hoff D on: 12/13/2022 04:39 PM   Modules accepted: Orders
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
## 2022-12-13 NOTE — Research (Unsigned)
Clarence Simpson seen today for her week 48 visit for [id number] study, A Phase 3, Randomized, Active-Controlled, Double-Blind Clinical Study to Evaluate the Antiretroviral Activity, Safety, and Tolerability of Doravirine/Islatravir (DOR/ISL 100 mg/0.25 mg) Once-Daily in HIV-1 Infected Treatment-Nave Participants. Excellent adherence with study medication. All procedures including DXA scan were completed per protocol. Study IP was dispensed. She has been having mouth pain and migraines for the past several days. Seen in the EDx2 and treated but states little relief. She is scheduled to see Dr. Daiva Eves this afternoon. She will return in July for her next study visit.

## 2022-12-14 ENCOUNTER — Other Ambulatory Visit (HOSPITAL_COMMUNITY): Payer: Self-pay

## 2022-12-14 LAB — CYTOLOGY, (ORAL, ANAL, URETHRAL) ANCILLARY ONLY
Chlamydia: NEGATIVE
Chlamydia: NEGATIVE
Comment: NEGATIVE
Comment: NEGATIVE
Comment: NORMAL
Comment: NORMAL
Neisseria Gonorrhea: NEGATIVE
Neisseria Gonorrhea: NEGATIVE

## 2022-12-14 LAB — URINE CYTOLOGY ANCILLARY ONLY
Chlamydia: NEGATIVE
Comment: NEGATIVE
Comment: NORMAL
Neisseria Gonorrhea: NEGATIVE

## 2022-12-15 LAB — RPR: RPR Ser Ql: REACTIVE — AB

## 2022-12-15 LAB — T PALLIDUM AB: T Pallidum Abs: POSITIVE — AB

## 2022-12-15 LAB — RPR TITER: RPR Titer: 1:2 {titer} — ABNORMAL HIGH

## 2022-12-17 ENCOUNTER — Other Ambulatory Visit (HOSPITAL_COMMUNITY): Payer: Self-pay

## 2022-12-20 LAB — CYTOLOGY - PAP

## 2022-12-24 ENCOUNTER — Telehealth: Payer: Self-pay

## 2022-12-24 NOTE — Telephone Encounter (Signed)
Patient aware of abnormal anal pap and will be referred to gen surgery. Patient stated that she has medicaid.   Will forward to front desk to see if they're able to find it in system.    Dorita Rowlands Lesli Albee, CMA

## 2022-12-26 ENCOUNTER — Other Ambulatory Visit (HOSPITAL_COMMUNITY): Payer: Self-pay

## 2023-01-16 ENCOUNTER — Ambulatory Visit: Payer: Medicaid Other | Admitting: Infectious Disease

## 2023-02-13 ENCOUNTER — Ambulatory Visit: Payer: Medicaid Other | Admitting: Infectious Disease

## 2023-03-05 ENCOUNTER — Encounter (INDEPENDENT_AMBULATORY_CARE_PROVIDER_SITE_OTHER): Payer: Self-pay | Admitting: *Deleted

## 2023-03-05 ENCOUNTER — Other Ambulatory Visit: Payer: Self-pay

## 2023-03-05 DIAGNOSIS — Z006 Encounter for examination for normal comparison and control in clinical research program: Secondary | ICD-10-CM

## 2023-03-05 MED ORDER — STUDY - MK-8591A-053 - BICTEGRAVIR/EMTRICITABINE/TENOFOVIR ALAFENAMIDE (BIKTARVY) 50-200-25 MG OR PLACEBO TABLET (PI-VAN DAM): ORAL_TABLET | ORAL | 0 refills | Status: DC

## 2023-03-05 MED ORDER — STUDY - MK-8591A-053 - MK-8591A 100/0.25 MG OR PLACEBO TABLET (PI-VAN DAM): ORAL_TABLET | ORAL | 0 refills | Status: DC

## 2023-03-05 NOTE — Research (Signed)
[name] seen today for her week 60 visit for [id number] study, A Phase 3, Randomized, Active-Controlled, Double-Blind Clinical Study to Evaluate the Antiretroviral Activity, Safety, and Tolerability of Doravirine/Islatravir (DOR/ISL 100 mg/0.25 mg) Once-Daily in HIV-1 Infected Treatment-Nave Participants. All procedures including completed per protocol. Study IP was dispensed. No new complaints or medications. She did miss several days of her study IP in April due to mouth and dental pain. States that she has been very adherent since that time. She is scheduled to see Dr. Daiva Eves in September and will return in October for her next study visit.

## 2023-03-29 ENCOUNTER — Ambulatory Visit: Payer: Self-pay | Admitting: Internal Medicine

## 2023-04-30 ENCOUNTER — Ambulatory Visit: Payer: Medicaid Other | Admitting: Infectious Disease

## 2023-04-30 ENCOUNTER — Encounter: Payer: Self-pay | Admitting: Infectious Disease

## 2023-04-30 DIAGNOSIS — Z789 Other specified health status: Secondary | ICD-10-CM | POA: Insufficient documentation

## 2023-04-30 HISTORY — DX: Other specified health status: Z78.9

## 2023-04-30 NOTE — Progress Notes (Deleted)
Subjective:  Chief complaint     Patient ID: Clarence Simpson, adult    DOB: 12/09/1985, 37 y.o.   MRN: [id number]  HPI  Clarence Simpson is a 37 year old Black Transgender woman who was diagnosed with HIV in 2023 and has enrolled into double blinded [employer] 053 study comparing Biktarvy/vs placebo Bitarvy to Placebo Isl/DOR vs actual Isltatravir/Doravirine STR.      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   Asthma    daily use of inhaler   Finger laceration 12/13/2021   Folliculitis 01/10/2022   Headache(784.0)    migraines   HIV disease (HCC) 12/13/2021   Screening examination for STI 12/13/2022   Severe headache 12/13/2022   Vaccine counseling 12/13/2022    Past Surgical History:  Procedure Laterality Date   LEFT HEART CATHETERIZATION WITH CORONARY ANGIOGRAM N/A 09/02/2014   Procedure: LEFT HEART CATHETERIZATION WITH CORONARY ANGIOGRAM;  Surgeon: Kathleene Hazel, MD;  Location: [facility] CATH LAB;  Service: Cardiovascular;  Laterality: N/A;   NO PAST SURGERIES     WART FULGURATION N/A 10/30/2012   Procedure: Fulgeration  Anal condylomata, Rectal exam under anesthesia;  Surgeon: Velora Heckler, MD;  Location: [facility];  Service: General;  Laterality: N/A;    Family History  Problem Relation Age of Onset   Heart failure Father       Social History   Socioeconomic History   Marital status: Single    Spouse name: Not on file   Number of children: Not on file   Years of education: Not on file   Highest education level: Not on file  Occupational History   Not on file  Tobacco Use   Smoking status: Every Day    Types: Cigars   Smokeless tobacco: Never   Tobacco comments:    smokes Black and Milds - 1/day  Vaping Use   Vaping status: Never Used  Substance and Sexual Activity   Alcohol use: Yes    Alcohol/week: 1.0 standard drink of alcohol    Types: 1 Standard drinks or equivalent per week    Comment: occasionally   Drug use: No    Sexual activity: Not on file    Comment: given condoms  Other Topics Concern   Not on file  Social History Narrative   Not on file   Social Determinants of Health   Financial Resource Strain: Not on file  Food Insecurity: Not on file  Transportation Needs: Not on file  Physical Activity: Not on file  Stress: Not on file  Social Connections: Not on file    No Known Allergies   Current Outpatient Medications:    albuterol (VENTOLIN HFA) 108 (90 Base) MCG/ACT inhaler, Inhale 2 puffs into the lungs every 6 (six) hours as needed for wheezing or shortness of breath., Disp: 8 g, Rfl: 4   Study - [id number] - bictegravir/emtrictabine/tenofovir alafenamide (BIKTARVY) 50-200-25 mg or placebo tablet ([facility]), For Investigational Drug Use Only. Take 1 tablet my mouth daily with water. Take at the same time every day. Please bring back all bottles to study coordinator at your next visit. Please contact [facility] regarding any question about this medication., Disp: 105 tablet, Rfl: 0   Study - [id number] - [id number] 100/0.25 mg or placebo tablet ([facility]), For Investigational Drug Use Only. Take 1 tablet my mouth daily with water. Take at the same time every day. Please bring back all bottles to study coordinator at your next visit.  Please contact [facility] regarding any question about this medication., Disp: 105 tablet, Rfl: 0    Review of Systems     Objective:   Physical Exam        Assessment & Plan:   HIV disease:  Viral load remains suppressed in study she continues on Islatravir/Doraviirine vs Biktarvy   GI screening: Screening for syphilis gonorrhea and chlamydia and urine oropharynx and rectum  Transgender health: Can check estradiol and testosterone level while continuing estradiol and spironolactone   Dental problems:    Vaccine counseling: recommended she receive updated flu shot today, prevnar 20. If she has not received them she would benefit from  meningococcal vaccination, possibly hep A and B--her serologies are in her research file

## 2023-05-28 ENCOUNTER — Encounter: Payer: Medicaid Other | Admitting: *Deleted

## 2023-06-04 ENCOUNTER — Other Ambulatory Visit: Payer: Self-pay

## 2023-06-04 ENCOUNTER — Encounter: Payer: Medicaid Other | Admitting: *Deleted

## 2023-06-04 DIAGNOSIS — Z006 Encounter for examination for normal comparison and control in clinical research program: Secondary | ICD-10-CM

## 2023-06-04 MED ORDER — STUDY - MK-8591A-053 - MK-8591A 100/0.25 MG OR PLACEBO TABLET (PI-VAN DAM)
ORAL_TABLET | ORAL | 0 refills | Status: DC
Start: 2023-06-04 — End: 2023-08-16

## 2023-06-04 MED ORDER — STUDY - MK-8591A-053 - BICTEGRAVIR/EMTRICITABINE/TENOFOVIR ALAFENAMIDE (BIKTARVY) 50-200-25 MG OR PLACEBO TABLET (PI-VAN DAM)
ORAL_TABLET | ORAL | 0 refills | Status: DC
Start: 2023-06-04 — End: 2023-08-16

## 2023-06-04 NOTE — Research (Signed)
Clarence Simpson seen today for her week 72 visit for [id number] study, A Phase 3, Randomized, Active-Controlled, Double-Blind Clinical Study to Evaluate the Antiretroviral Activity, Safety, and Tolerability of Doravirine/Islatravir (DOR/ISL 100 mg/0.25 mg) Once-Daily in HIV-1 Infected Treatment-Nave Participants. All procedures completed per protocol. Study IP was dispensed. No new complaints or medications. Excellent adherence with her study IP. She will return in November to see Dr. Daiva Eves and then in December for her next study visit.

## 2023-06-19 ENCOUNTER — Ambulatory Visit
Admission: EM | Admit: 2023-06-19 | Discharge: 2023-06-19 | Disposition: A | Discharge status: Home / Self Care | Payer: Medicaid Other | Attending: Internal Medicine | Admitting: Internal Medicine

## 2023-06-19 DIAGNOSIS — S0993XA Unspecified injury of face, initial encounter: Secondary | ICD-10-CM

## 2023-06-19 DIAGNOSIS — K0889 Other specified disorders of teeth and supporting structures: Secondary | ICD-10-CM

## 2023-06-19 MED ORDER — IBUPROFEN 800 MG PO TABS
800.0000 mg | ORAL_TABLET | Freq: Once | ORAL | Status: AC
  Administered 2023-06-19: 800 mg via ORAL

## 2023-06-19 MED ORDER — IBUPROFEN 800 MG PO TABS
800.0000 mg | ORAL_TABLET | Freq: Three times a day (TID) | ORAL | 0 refills | Status: DC | PRN
Start: 2023-06-19 — End: 2024-04-10

## 2023-06-19 MED ORDER — AMOXICILLIN 875 MG PO TABS
875.0000 mg | ORAL_TABLET | Freq: Two times a day (BID) | ORAL | 0 refills | Status: AC
Start: 2023-06-19 — End: 2023-06-29

## 2023-06-19 NOTE — ED Provider Notes (Signed)
[facility]    CSN: [id number] Arrival date & time: 06/19/23  9562      History   Chief Complaint No chief complaint on file.   HPI Clarence Simpson is a 37 y.o. adult presents for dental pain/injury.  Patient is a transgender male reports a month ago she believes she broke his right lower molar/wisdom tooth.  States she has been having pain intermittently that seems to have worsened over the past few days.  Denies any fevers, facial swelling, gum swelling.  She does have a history of dental infections in the past and is also HIV positive.  Patient has used a previous prescription for Magic mouthwash, Orajel, and Excedrin OTC for symptoms.  Patient states they do not have a dentist.  HPI  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   Asthma    daily use of inhaler   Finger laceration 12/13/2021   Folliculitis 01/10/2022   Headache(784.0)    migraines   HIV disease (HCC) 12/13/2021   Screening examination for STI 12/13/2022   Severe headache 12/13/2022   Transgender 04/30/2023   Vaccine counseling 12/13/2022    Patient Active Problem List   Diagnosis Date Noted   Transgender 04/30/2023   Severe headache 12/13/2022   Screening examination for STI 12/13/2022   Vaccine counseling 12/13/2022   Folliculitis 01/10/2022   HIV disease (HCC) 12/13/2021   Finger laceration 12/13/2021   Precordial chest pain, possible pericarditis, negative MI 09/02/2014   Asthma 09/02/2014   Generalized pain 09/02/2014   Tobacco abuse 09/02/2014   Abnormal EKG 09/02/2014   Protein-calorie malnutrition, severe, with illness 09/02/2014   Syncope, possibly related to pain and having protein calorie malnutrition 09/02/2014   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    Past Surgical History:  Procedure Laterality Date   LEFT HEART CATHETERIZATION WITH CORONARY ANGIOGRAM N/A 09/02/2014   Procedure: LEFT HEART CATHETERIZATION WITH CORONARY ANGIOGRAM;  Surgeon: Kathleene Hazel, MD;  Location: [facility] CATH LAB;  Service: Cardiovascular;  Laterality: N/A;   NO PAST SURGERIES     WART FULGURATION N/A 10/30/2012   Procedure: Fulgeration  Anal condylomata, Rectal exam under anesthesia;  Surgeon: Velora Heckler, MD;  Location: [facility];  Service: General;  Laterality: N/A;       Home Medications    Prior to Admission medications   Medication Sig Start Date End Date Taking? Authorizing Provider  amoxicillin (AMOXIL) 875 MG tablet Take 1 tablet (875 mg total) by mouth 2 (two) times daily for 10 days. 06/19/23 06/29/23 Yes Radford Pax, NP  ibuprofen (ADVIL) 800 MG tablet Take 1 tablet (800 mg total) by mouth every 8 (eight) hours as needed (Dental pain). 06/19/23  Yes Radford Pax, NP  albuterol (VENTOLIN HFA) 108 (90 Base) MCG/ACT inhaler Inhale 2 puffs into the lungs every 6 (six) hours as needed for wheezing or shortness of breath. 01/10/22   Randall Hiss, MD  Study [phone] - bictegravir/emtrictabine/tenofovir alafenamide (BIKTARVY) 50-200-25 mg or placebo tablet [facility]) For Investigational Drug Use Only. Take 1 tablet my mouth daily with water. Take at the same time every day. Please bring back all bottles to study coordinator at your next visit. Please contact [facility] regarding any question about this medication. 06/04/23   Judeth Cornfield  Study - [id number] - [id number] 100/0.25 mg or placebo tablet [facility]) For Investigational Drug Use Only. Take 1 tablet my mouth daily with water. Take at  the same time every day. Please bring back all bottles to study coordinator at your next visit. Please contact [facility] regarding any question about this medication. 06/04/23   Horton Finer, PA-C    Family History Family History  Problem Relation Age of Onset   Heart failure Father     Social History Social History   Tobacco Use   Smoking status: Every Day    Types: Cigars   Smokeless tobacco: Never    Tobacco comments:    smokes Black and Milds - 1/day  Vaping Use   Vaping status: Never Used  Substance Use Topics   Alcohol use: Yes    Alcohol/week: 1.0 standard drink of alcohol    Types: 1 Standard drinks or equivalent per week    Comment: occasionally   Drug use: No     Allergies   Patient has no known allergies.   Review of Systems Review of Systems  HENT:  Positive for dental problem.      Physical Exam Triage Vital Signs ED Triage Vitals  Encounter Vitals Group     BP 06/19/23 0835 122/80     Systolic BP Percentile --      Diastolic BP Percentile --      Pulse Rate 06/19/23 0835 72     Resp 06/19/23 0835 16     Temp 06/19/23 0835 97.9 F (36.6 C)     Temp Source 06/19/23 0835 Oral     SpO2 06/19/23 0835 97 %     Weight --      Height --      Head Circumference --      Peak Flow --      Pain Score 06/19/23 0834 10     Pain Loc --      Pain Education --      Exclude from Growth Chart --    No data found.  Updated Vital Signs BP 122/80 (BP Location: Right Arm)   Pulse 72   Temp 97.9 F (36.6 C) (Oral)   Resp 16   SpO2 97%   Visual Acuity Right Eye Distance:   Left Eye Distance:   Bilateral Distance:    Right Eye Near:   Left Eye Near:    Bilateral Near:     Physical Exam Vitals and nursing note reviewed.  Constitutional:      General: She is not in acute distress.    Appearance: Normal appearance. She is not ill-appearing.  HENT:     Head: Normocephalic and atraumatic.     Mouth/Throat:     Lips: Pink.     Mouth: Mucous membranes are moist.     Dentition: Dental tenderness and dental caries present. No dental abscesses.      Comments: Tooth #32 is broken with tenderness to palpation to the gumline.  No visible swelling or abscess.  Significant dental caries throughout mouth noted. Eyes:     Pupils: Pupils are equal, round, and reactive to light.  Cardiovascular:     Rate and Rhythm: Normal rate.  Pulmonary:     Effort: Pulmonary  effort is normal.  Skin:    General: Skin is warm and dry.  Neurological:     General: No focal deficit present.     Mental Status: She is alert and oriented to person, place, and time.  Psychiatric:        Mood and Affect: Mood normal.        Behavior: Behavior normal.  UC Treatments / Results  Labs (all labs ordered are listed, but only abnormal results are displayed) Labs Reviewed - No data to display  EKG   Radiology No results found.  Procedures Procedures (including critical care time)  Medications Ordered in UC Medications  ibuprofen (ADVIL) tablet 800 mg (has no administration in time range)    Initial Impression / Assessment and Plan / UC Course  I have reviewed the triage vital signs and the nursing notes.  Pertinent labs & imaging results that were available during my care of the patient were reviewed by me and considered in my medical decision making (see chart for details).     Reviewed exam and symptoms with patient.  Will start amoxicillin and ibuprofen as needed for pain.  She was given a dose in the clinic.  Advised patient will need to see dentist ASAP for further treatment as this is only temporary treatment for symptoms and pain will not resolve until she sees a dentist.  Patient verbalized understanding.  ER precautions reviewed and patient verbalized understanding Final Clinical Impressions(s) / UC Diagnoses   Final diagnoses:  Pain, dental  Dental injury, initial encounter     Discharge Instructions      Start amoxicillin twice daily for 10 days.  You may take ibuprofen every 8 hours as needed for pain.  Please follow-up with a dentist as soon as possible for further treatment of your tooth concern.  Please go to the ER if you develop any worsening symptoms.  This includes but is not limited to fever, facial swelling, uncontrolled pain, or any new concerns that arise.  I hope you feel better soon!   ED Prescriptions     Medication Sig  Dispense Auth. Provider   amoxicillin (AMOXIL) 875 MG tablet Take 1 tablet (875 mg total) by mouth 2 (two) times daily for 10 days. 20 tablet Radford Pax, NP   ibuprofen (ADVIL) 800 MG tablet Take 1 tablet (800 mg total) by mouth every 8 (eight) hours as needed (Dental pain). 14 tablet Radford Pax, NP      PDMP not reviewed this encounter.   Radford Pax, NP 06/19/23 [phone]

## 2023-06-19 NOTE — ED Triage Notes (Signed)
Pt presents to UC w/ c/o toothache bottom right side x1 month. Pt states "I think it's my wisdom tooth." Home remedies: extra strength excedrin, orajel, "lidocaine mouth wash"

## 2023-06-19 NOTE — Discharge Instructions (Addendum)
Start amoxicillin twice daily for 10 days.  You may take ibuprofen every 8 hours as needed for pain.  Please follow-up with a dentist as soon as possible for further treatment of your tooth concern.  Please go to the ER if you develop any worsening symptoms.  This includes but is not limited to fever, facial swelling, uncontrolled pain, or any new concerns that arise.  I hope you feel better soon!

## 2023-06-20 ENCOUNTER — Ambulatory Visit: Payer: Medicaid Other

## 2023-07-15 NOTE — Progress Notes (Unsigned)
   07/15/2023  HPI: Clarence Simpson is a 37 y.o. adult who presents to the [facility] clinic today for STI testing.  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   IBUPROFEN (ADVIL) 800 MG TABLET    Take 1 tablet (800 mg total) by mouth every 8 (eight) hours as needed (Dental pain).   STUDY - [id number] - BICTEGRAVIR/EMTRICTABINE/TENOFOVIR ALAFENAMIDE (BIKTARVY) 50-200-25 MG OR PLACEBO TABLET ([facility])    For Investigational Drug Use Only. Take 1 tablet my mouth daily with water. Take at the same time every day. Please bring back all bottles to study coordinator at your next visit. Please contact [facility] regarding any question about this medication.   STUDY - [id number] - [id number] 100/0.25 MG OR PLACEBO TABLET ([facility])    For Investigational Drug Use Only. Take 1 tablet my mouth daily with water. Take at the same time every day. Please bring back all bottles to study coordinator at your next visit. Please contact [facility] regarding any question about this medication.  Modified Medications   No medications on file  Discontinued Medications   No  medications on file    Assessment: Clarence Simpson presents today to obtain STI testing. She also sees the Dr. Daiva Eves for HIV treatment, participating in the [phone] study. Next follow up with Dr. Zenaida Niece dam on 07/22/23. Clarence Simpson reports a rash on her genitals. It is not painful, no discharge. She noticed she couldn't get to work Sunday night because of how fatigued she was.  Dr. Luciana Axe examined the area. It does not appear to be STI related. Recommend hydrocortisone cream. Patient is amenable to this. We will obtain STI testing as well.  Immunizations: Due for COVID and influenza, PCV20, HPV 1/3, and Menveo 1/2 vaccines today. Clarence Simpson declines these today.  Plan: - STI screening: RPR, urine/rectal/pharyngeal GC/CT swabs for cytology today - F/u results to see if treatment is needed - Hydrocortisone to [facility]  Lora Paula, PharmD PGY-2 Infectious Diseases Pharmacy Resident [facility] 07/15/2023 6:59 PM

## 2023-07-16 ENCOUNTER — Other Ambulatory Visit (HOSPITAL_COMMUNITY)
Admission: RE | Admit: 2023-07-16 | Discharge: 2023-07-16 | Disposition: A | Discharge status: Home / Self Care | Payer: Medicaid Other | Source: Ambulatory Visit | Attending: Infectious Disease | Admitting: Infectious Disease

## 2023-07-16 ENCOUNTER — Ambulatory Visit (INDEPENDENT_AMBULATORY_CARE_PROVIDER_SITE_OTHER): Payer: Medicaid Other | Admitting: Pharmacist

## 2023-07-16 ENCOUNTER — Other Ambulatory Visit: Payer: Self-pay

## 2023-07-16 DIAGNOSIS — Z113 Encounter for screening for infections with a predominantly sexual mode of transmission: Secondary | ICD-10-CM | POA: Insufficient documentation

## 2023-07-16 DIAGNOSIS — R21 Rash and other nonspecific skin eruption: Secondary | ICD-10-CM

## 2023-07-16 MED ORDER — HYDROCORTISONE 1 % EX CREA: 1.0000 | TOPICAL_CREAM | Freq: Two times a day (BID) | CUTANEOUS | 0 refills | Status: DC

## 2023-07-17 LAB — CYTOLOGY, (ORAL, ANAL, URETHRAL) ANCILLARY ONLY
Chlamydia: NEGATIVE
Chlamydia: NEGATIVE
Comment: NEGATIVE
Comment: NEGATIVE
Comment: NORMAL
Comment: NORMAL
Neisseria Gonorrhea: NEGATIVE
Neisseria Gonorrhea: NEGATIVE

## 2023-07-17 LAB — URINE CYTOLOGY ANCILLARY ONLY
Chlamydia: NEGATIVE
Comment: NEGATIVE
Comment: NORMAL
Neisseria Gonorrhea: NEGATIVE

## 2023-07-19 LAB — RPR TITER: RPR Titer: 1:2 {titer} — ABNORMAL HIGH

## 2023-07-19 LAB — RPR: RPR Ser Ql: REACTIVE — AB

## 2023-07-19 LAB — T PALLIDUM AB: T Pallidum Abs: POSITIVE — AB

## 2023-07-22 ENCOUNTER — Encounter: Payer: Self-pay | Admitting: Infectious Disease

## 2023-07-22 ENCOUNTER — Ambulatory Visit (INDEPENDENT_AMBULATORY_CARE_PROVIDER_SITE_OTHER): Payer: Medicaid Other | Admitting: Infectious Disease

## 2023-07-22 ENCOUNTER — Other Ambulatory Visit (HOSPITAL_COMMUNITY): Payer: Self-pay

## 2023-07-22 ENCOUNTER — Other Ambulatory Visit: Payer: Self-pay

## 2023-07-22 VITALS — BP 111/75 | HR 83 | Resp 16 | Ht 74.0 in | Wt 177.8 lb

## 2023-07-22 DIAGNOSIS — R85612 Low grade squamous intraepithelial lesion on cytologic smear of anus (LGSIL): Secondary | ICD-10-CM

## 2023-07-22 DIAGNOSIS — Z7185 Encounter for immunization safety counseling: Secondary | ICD-10-CM | POA: Diagnosis not present

## 2023-07-22 DIAGNOSIS — Z789 Other specified health status: Secondary | ICD-10-CM

## 2023-07-22 DIAGNOSIS — B20 Human immunodeficiency virus [HIV] disease: Secondary | ICD-10-CM | POA: Diagnosis present

## 2023-07-22 DIAGNOSIS — A630 Anogenital (venereal) warts: Secondary | ICD-10-CM | POA: Diagnosis not present

## 2023-07-22 HISTORY — DX: Low grade squamous intraepithelial lesion on cytologic smear of anus (LGSIL): R85.612

## 2023-07-22 NOTE — Progress Notes (Signed)
Subjective:    Chief complaint: follow-up for HIV disease    Patient ID: Clarence Simpson, adult    DOB: 1986/03/06, 37 y.o.   MRN: [id number]  HPI   Assessment and Plan    HIV Viral load undetectable and CD4 count 878 on Lotrevir and Doravirine as part of a research study. No recent STIs. Low syphilis titer not consistent with active disease. -Continue current antiretroviral therapy as part of the research study.  Abnormal Anal Pap Smear Abnormal result from April. Patient missed referral appointment for anoscopy. -Refer to general surgery for anoscopy to rule out rectal cancer.  Vaccination Status Patient is unvaccinated for COVID-19, influenza, pneumococcal disease, and meningitis. Discussed the importance and benefits of these vaccines, particularly in the context of the patient's HIV status. -Administer COVID-19 vaccine today, as patient agreed to this. -Consider administration of influenza, pneumococcal, and meningitis vaccines in the future.               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   Asthma    daily use of inhaler   Finger laceration 12/13/2021   Folliculitis 01/10/2022   Headache(784.0)    migraines   HIV disease (HCC) 12/13/2021   Screening examination for STI 12/13/2022   Severe headache 12/13/2022   Transgender 04/30/2023   Vaccine counseling 12/13/2022    Past Surgical History:  Procedure Laterality Date   LEFT HEART CATHETERIZATION WITH CORONARY ANGIOGRAM N/A 09/02/2014   Procedure: LEFT HEART CATHETERIZATION WITH CORONARY ANGIOGRAM;  Surgeon: Kathleene Hazel, MD;  Location: [facility] CATH LAB;  Service: Cardiovascular;  Laterality: N/A;   NO PAST SURGERIES     WART FULGURATION N/A 10/30/2012   Procedure: Fulgeration  Anal condylomata, Rectal exam under anesthesia;  Surgeon: Velora Heckler, MD;  Location: [facility];  Service: General;  Laterality: N/A;    Family History  Problem Relation Age of Onset    Heart failure Father       Social History   Socioeconomic History   Marital status: Single    Spouse name: Not on file   Number of children: Not on file   Years of education: Not on file   Highest education level: Not on file  Occupational History   Not on file  Tobacco Use   Smoking status: Every Day    Types: Cigars   Smokeless tobacco: Never   Tobacco comments:    smokes Black and Milds - 1/day  Vaping Use   Vaping status: Never Used  Substance and Sexual Activity   Alcohol use: Yes    Alcohol/week: 1.0 standard drink of alcohol    Types: 1 Standard drinks or equivalent per week    Comment: occasionally   Drug use: No   Sexual activity: Not on file    Comment: given condoms  Other Topics Concern   Not on file  Social History Narrative   Not on file   Social Determinants of Health   Financial Resource Strain: Not on file  Food Insecurity: Not on file  Transportation Needs: Not on file  Physical Activity: Not on file  Stress: Not on file  Social Connections: Not on file    No Known Allergies   Current Outpatient Medications:    albuterol (VENTOLIN HFA) 108 (90 Base) MCG/ACT inhaler, Inhale 2 puffs into the lungs every 6 (six) hours as needed for wheezing or shortness of breath., Disp: 8 g, Rfl: 4   hydrocortisone cream 1 %,  Apply 1 Application topically 2 (two) times daily. Apply to affected area, Disp: 30 g, Rfl: 0   ibuprofen (ADVIL) 800 MG tablet, Take 1 tablet (800 mg total) by mouth every 8 (eight) hours as needed (Dental pain)., Disp: 14 tablet, Rfl: 0   Study - [id number] - bictegravir/emtrictabine/tenofovir alafenamide (BIKTARVY) 50-200-25 mg or placebo tablet ([facility]), For Investigational Drug Use Only. Take 1 tablet my mouth daily with water. Take at the same time every day. Please bring back all bottles to study coordinator at your next visit. Please contact [facility] regarding any question about this medication., Disp: 105 tablet, Rfl: 0    Study - [id number] - [id number] 100/0.25 mg or placebo tablet ([facility]), For Investigational Drug Use Only. Take 1 tablet my mouth daily with water. Take at the same time every day. Please bring back all bottles to study coordinator at your next visit. Please contact [facility] regarding any question about this medication., Disp: 105 tablet, Rfl: 0   Review of Systems  Constitutional:  Negative for activity change, appetite change, chills, diaphoresis, fatigue, fever and unexpected weight change.  HENT:  Negative for congestion, rhinorrhea, sinus pressure, sneezing, sore throat and trouble swallowing.   Eyes:  Negative for photophobia and visual disturbance.  Respiratory:  Negative for cough, chest tightness, shortness of breath, wheezing and stridor.   Cardiovascular:  Negative for chest pain, palpitations and leg swelling.  Gastrointestinal:  Negative for abdominal distention, abdominal pain, anal bleeding, blood in stool, constipation, diarrhea, nausea and vomiting.  Genitourinary:  Negative for difficulty urinating, dysuria, flank pain and hematuria.  Musculoskeletal:  Negative for arthralgias, back pain, gait problem, joint swelling and myalgias.  Skin:  Negative for color change, pallor, rash and wound.  Neurological:  Negative for dizziness, tremors, weakness and light-headedness.  Hematological:  Negative for adenopathy. Does not bruise/bleed easily.  Psychiatric/Behavioral:  Negative for agitation, behavioral problems, confusion, decreased concentration, dysphoric mood and sleep disturbance.        Objective:   Physical Exam Constitutional:      Appearance: She is well-developed.  HENT:     Head: Normocephalic and atraumatic.  Eyes:     Conjunctiva/sclera: Conjunctivae normal.  Cardiovascular:     Rate and Rhythm: Normal rate and regular rhythm.  Pulmonary:     Effort: Pulmonary effort is normal. No respiratory distress.     Breath sounds: No wheezing.  Abdominal:      General: There is no distension.     Palpations: Abdomen is soft.  Musculoskeletal:        General: No tenderness. Normal range of motion.     Cervical back: Normal range of motion and neck supple.  Skin:    General: Skin is warm and dry.     Coloration: Skin is not pale.     Findings: No erythema or rash.  Neurological:     General: No focal deficit present.     Mental Status: She is alert and oriented to person, place, and time.  Psychiatric:        Mood and Affect: Mood normal.        Behavior: Behavior normal.        Thought Content: Thought content normal.        Judgment: Judgment normal.           Assessment & Plan:   Assessment and Plan    HIV Viral load undetectable and CD4 count 878 on Lotrevir and Doravirine as part of a  research study. No recent STIs. Low syphilis titer not consistent with active disease. -Continue current antiretroviral therapy as part of the research study.  Abnormal Anal Pap Smear Abnormal result from April. Patient missed referral appointment for anoscopy. -Refer to general surgery for anoscopy to rule out rectal cancer.  Vaccination Status Patient is unvaccinated for COVID-19, influenza, pneumococcal disease, and meningitis. Discussed the importance and benefits of these vaccines, particularly in the context of the patient's HIV status. -Administer COVID-19 vaccine today, as patient agreed to this. -Consider administration of influenza, pneumococcal, and meningitis vaccines in the future.

## 2023-08-16 ENCOUNTER — Other Ambulatory Visit: Payer: Self-pay

## 2023-08-16 DIAGNOSIS — Z006 Encounter for examination for normal comparison and control in clinical research program: Secondary | ICD-10-CM

## 2023-08-16 MED ORDER — STUDY - MK-8591A-053 - BICTEGRAVIR/EMTRICITABINE/TENOFOVIR ALAFENAMIDE (BIKTARVY) 50-200-25 MG OR PLACEBO TABLET (PI-VAN DAM): ORAL_TABLET | ORAL | 0 refills | Status: DC

## 2023-08-16 MED ORDER — STUDY - MK-8591A-053 - MK-8591A 100/0.25 MG OR PLACEBO TABLET (PI-VAN DAM): ORAL_TABLET | ORAL | 0 refills | Status: DC

## 2023-08-16 NOTE — Research (Signed)
Individual seen for research visit Week 84 for [id number], A Phase 3, Randomized, Active-Controlled, Double-Blind Clinical Study to Evaluate the Antiretroviral Activity, Safety, and Tolerability of Doravirine/Islatravir (DOR/ISL 100 mg/0.25 mg) Once-Daily in HIV-1 Infected Treatment-Nave Participants.  Updated consent required, Inform Consent explained/reviewed, risk, benefits, responsibilities and other options were reviewed. Questions answered, comprehension of the study was assessed and adequate time to consider options was provided. Individual verbalized understanding and signed the informed consent witnessed by study coordinator. Signed consent was obtained prior to any procedures being done. Overall individual is doing well. All procedures carried out per protocol. Study medications dispensed. Plan to see participant again in 12 weeks.

## 2023-08-22 ENCOUNTER — Encounter: Payer: Medicaid Other | Admitting: *Deleted

## 2023-09-03 ENCOUNTER — Ambulatory Visit: Payer: Self-pay | Admitting: General Surgery

## 2023-09-03 NOTE — H&P (Signed)
  ERRING PHYSICIAN:  Fleeta Rothman, [address]  PROVIDER:  BERNARDA WANDA NED, MD  MRN: [id number] DOB: 1986-01-21 DATE OF ENCOUNTER: 09/03/2023  Subjective   Chief Complaint: New Consultation     History of Present Illness: Clarence Simpson is a 38 y.o. male who is seen today as an office consultation at the request of Dr. Fleeta Rothman for evaluation of New Consultation .  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  Has a h/o anal warts, s/p fulguration in 2014.   Review of Systems: A complete review of systems was obtained from the patient.  I have reviewed this information and discussed as appropriate with the patient.  See HPI as well for other ROS.   Medical History: Past Medical History:  Diagnosis Date   Asthma, unspecified asthma severity, unspecified whether complicated, unspecified whether persistent (HHS-HCC)    HIV -AIDS with opportunistic infection, Symptomatic (CMS/HHS-HCC)     There is no problem list on file for this patient.   History reviewed. No pertinent surgical history.   No Known Allergies  No current outpatient medications on file prior to visit.   No current facility-administered medications on file prior to visit.    Family History  Problem Relation Age of Onset   Skin cancer Mother    Coronary Artery Disease (Blocked arteries around heart) Mother    Deep vein thrombosis (DVT or abnormal blood clot formation) Mother      Social History   Tobacco Use  Smoking Status Some Days   Types: Cigarettes  Smokeless Tobacco Never     Social History   Socioeconomic History   Marital status: Single  Tobacco Use   Smoking status: Some Days    Types: Cigarettes   Smokeless tobacco: Never  Substance and Sexual Activity   Alcohol use: Yes   Drug use: Never    Objective:    Vitals:   09/03/23 1120  BP: 100/72  Temp: 36.6 C (97.8 F)  Weight: 78.7 kg (173 lb 6.4 oz)  Height: 185.4 cm (6' 1)  PainSc: 0-No pain      Exam Gen: NAD   Labs, Imaging and Diagnostic Testing: HIV: ND CD4 876 Assessment and Plan:  Diagnoses and all orders for this visit:  LGSIL Pap smear of anus    We discussed that this is usually due to anal warts.  We discussed the procedure of high-resolution anoscopy that can be done to help identify and destroy these areas.  We discussed that typical postprocedural side effects are pain and occasional bleeding.  These tend to resolve within 1 to 2 weeks.  Patient would like to proceed.  Bernarda JAYSON Ned, MD Colon and Rectal Surgery [facility]

## 2023-11-01 ENCOUNTER — Other Ambulatory Visit: Payer: Self-pay

## 2023-11-01 ENCOUNTER — Encounter (HOSPITAL_BASED_OUTPATIENT_CLINIC_OR_DEPARTMENT_OTHER): Payer: Self-pay | Admitting: General Surgery

## 2023-11-08 ENCOUNTER — Ambulatory Visit (HOSPITAL_BASED_OUTPATIENT_CLINIC_OR_DEPARTMENT_OTHER): Admitting: Anesthesiology

## 2023-11-08 ENCOUNTER — Encounter (HOSPITAL_BASED_OUTPATIENT_CLINIC_OR_DEPARTMENT_OTHER)
Admission: RE | Disposition: A | Discharge status: Home / Self Care | Payer: Self-pay | Source: Home / Self Care | Attending: General Surgery

## 2023-11-08 ENCOUNTER — Encounter (HOSPITAL_BASED_OUTPATIENT_CLINIC_OR_DEPARTMENT_OTHER): Payer: Self-pay | Admitting: General Surgery

## 2023-11-08 ENCOUNTER — Other Ambulatory Visit: Payer: Self-pay

## 2023-11-08 ENCOUNTER — Ambulatory Visit (HOSPITAL_BASED_OUTPATIENT_CLINIC_OR_DEPARTMENT_OTHER)
Admission: RE | Admit: 2023-11-08 | Discharge: 2023-11-08 | Disposition: A | Discharge status: Home / Self Care | Payer: Medicaid Other | Attending: General Surgery | Admitting: General Surgery

## 2023-11-08 DIAGNOSIS — R519 Headache, unspecified: Secondary | ICD-10-CM | POA: Insufficient documentation

## 2023-11-08 DIAGNOSIS — F17210 Nicotine dependence, cigarettes, uncomplicated: Secondary | ICD-10-CM | POA: Diagnosis not present

## 2023-11-08 DIAGNOSIS — A630 Anogenital (venereal) warts: Secondary | ICD-10-CM

## 2023-11-08 DIAGNOSIS — K6282 Dysplasia of anus: Secondary | ICD-10-CM | POA: Insufficient documentation

## 2023-11-08 DIAGNOSIS — J45909 Unspecified asthma, uncomplicated: Secondary | ICD-10-CM | POA: Diagnosis not present

## 2023-11-08 HISTORY — PX: HIGH RESOLUTION ANOSCOPY: SHX6345

## 2023-11-08 HISTORY — PX: RECTAL BIOPSY: SHX2303

## 2023-11-08 SURGERY — ANOSCOPY, HIGH RESOLUTION
Anesthesia: Monitor Anesthesia Care | Site: Rectum

## 2023-11-08 MED ORDER — BUPIVACAINE-EPINEPHRINE 0.5% -1:200000 IJ SOLN
INTRAMUSCULAR | Status: DC | PRN
  Administered 2023-11-08: 27 mL

## 2023-11-08 MED ORDER — ACETAMINOPHEN 500 MG PO TABS
1000.0000 mg | ORAL_TABLET | Freq: Once | ORAL | Status: AC
  Administered 2023-11-08: 1000 mg via ORAL

## 2023-11-08 MED ORDER — LIDOCAINE 2% (20 MG/ML) 5 ML SYRINGE
INTRAMUSCULAR | Status: AC
  Filled 2023-11-08: qty 5

## 2023-11-08 MED ORDER — ACETIC ACID 5 % SOLN
Status: DC | PRN
  Administered 2023-11-08: 1 via TOPICAL

## 2023-11-08 MED ORDER — TRAMADOL HCL 50 MG PO TABS: 50.0000 mg | ORAL_TABLET | Freq: Four times a day (QID) | ORAL | 0 refills | Status: DC | PRN

## 2023-11-08 MED ORDER — PROPOFOL 10 MG/ML IV BOLUS
INTRAVENOUS | Status: AC
Start: 2023-11-08 — End: ?
  Filled 2023-11-08: qty 20

## 2023-11-08 MED ORDER — FENTANYL CITRATE (PF) 100 MCG/2ML IJ SOLN
INTRAMUSCULAR | Status: AC
  Filled 2023-11-08: qty 2

## 2023-11-08 MED ORDER — FENTANYL CITRATE (PF) 100 MCG/2ML IJ SOLN: 25.0000 ug | INTRAMUSCULAR | Status: DC | PRN

## 2023-11-08 MED ORDER — BUPIVACAINE LIPOSOME 1.3 % IJ SUSP
INTRAMUSCULAR | Status: AC
  Filled 2023-11-08: qty 20

## 2023-11-08 MED ORDER — FENTANYL CITRATE (PF) 100 MCG/2ML IJ SOLN
INTRAMUSCULAR | Status: DC | PRN
  Administered 2023-11-08 (×2): 25 ug via INTRAVENOUS

## 2023-11-08 MED ORDER — ONDANSETRON HCL 4 MG/2ML IJ SOLN
INTRAMUSCULAR | Status: AC
  Filled 2023-11-08: qty 2

## 2023-11-08 MED ORDER — LACTATED RINGERS IV SOLN: INTRAVENOUS | Status: DC

## 2023-11-08 MED ORDER — STERILE WATER FOR IRRIGATION IR SOLN
Status: DC | PRN
  Administered 2023-11-08: 1000 mL

## 2023-11-08 MED ORDER — PROPOFOL 500 MG/50ML IV EMUL
INTRAVENOUS | Status: AC
  Filled 2023-11-08: qty 50

## 2023-11-08 MED ORDER — PROPOFOL 500 MG/50ML IV EMUL
INTRAVENOUS | Status: DC | PRN
  Administered 2023-11-08: 150 ug/kg/min via INTRAVENOUS

## 2023-11-08 MED ORDER — ACETIC ACID 5 % SOLN
Status: AC
  Filled 2023-11-08: qty 100

## 2023-11-08 MED ORDER — ACETAMINOPHEN 500 MG PO TABS
ORAL_TABLET | ORAL | Status: AC
  Filled 2023-11-08: qty 2

## 2023-11-08 MED ORDER — SODIUM CHLORIDE 0.9% FLUSH: 3.0000 mL | Freq: Two times a day (BID) | INTRAVENOUS | Status: DC

## 2023-11-08 MED ORDER — ONDANSETRON HCL 4 MG/2ML IJ SOLN
INTRAMUSCULAR | Status: DC | PRN
  Administered 2023-11-08: 4 mg via INTRAVENOUS

## 2023-11-08 MED ORDER — PROPOFOL 10 MG/ML IV BOLUS
INTRAVENOUS | Status: AC
  Filled 2023-11-08: qty 20

## 2023-11-08 MED ORDER — LIDOCAINE 2% (20 MG/ML) 5 ML SYRINGE
INTRAMUSCULAR | Status: DC | PRN
  Administered 2023-11-08: 60 mg via INTRAVENOUS

## 2023-11-08 MED ORDER — BUPIVACAINE-EPINEPHRINE (PF) 0.5% -1:200000 IJ SOLN
INTRAMUSCULAR | Status: AC
  Filled 2023-11-08: qty 120

## 2023-11-08 MED ORDER — MIDAZOLAM HCL 2 MG/2ML IJ SOLN
INTRAMUSCULAR | Status: DC | PRN
  Administered 2023-11-08: 2 mg via INTRAVENOUS

## 2023-11-08 MED ORDER — MIDAZOLAM HCL 2 MG/2ML IJ SOLN
INTRAMUSCULAR | Status: AC
  Filled 2023-11-08: qty 2

## 2023-11-08 SURGICAL SUPPLY — 34 items
BLADE SURG 10 STRL SS (BLADE) ×2 IMPLANT
BRIEF MESH DISP 2XL (UNDERPADS AND DIAPERS) ×2 IMPLANT
COVER BACK TABLE 60X90IN (DRAPES) ×2 IMPLANT
DRAPE HYSTEROSCOPY (MISCELLANEOUS) IMPLANT
ELECT REM PT RETURN 9FT ADLT (ELECTROSURGICAL) ×2 IMPLANT
ELECTRODE REM PT RTRN 9FT ADLT (ELECTROSURGICAL) ×2 IMPLANT
GAUZE 4X4 16PLY ~~LOC~~+RFID DBL (SPONGE) ×2 IMPLANT
GAUZE PAD ABD 8X10 STRL (GAUZE/BANDAGES/DRESSINGS) ×2 IMPLANT
GAUZE SPONGE 4X4 12PLY STRL (GAUZE/BANDAGES/DRESSINGS) IMPLANT
GLOVE BIO SURGEON STRL SZ 6.5 (GLOVE) ×2 IMPLANT
GLOVE BIOGEL PI IND STRL 7.0 (GLOVE) IMPLANT
GLOVE INDICATOR 6.5 STRL GRN (GLOVE) ×2 IMPLANT
GLOVE SURG SS PI 7.0 STRL IVOR (GLOVE) IMPLANT
GOWN STRL REUS W/TWL LRG LVL3 (GOWN DISPOSABLE) IMPLANT
GOWN STRL REUS W/TWL XL LVL3 (GOWN DISPOSABLE) ×2 IMPLANT
HIBICLENS CHG 4% 4OZ BTL (MISCELLANEOUS) IMPLANT
LEGGING LITHOTOMY PAIR STRL (DRAPES) IMPLANT
NDL BIOPSY 14X6 SOFT TISS (NEEDLE) IMPLANT
NDL HYPO 22X1.5 SAFETY MO (MISCELLANEOUS) ×2 IMPLANT
NDL SAFETY ECLIPSE 18X1.5 (NEEDLE) IMPLANT
NEEDLE BIOPSY 14X6 SOFT TISS (NEEDLE) IMPLANT
NEEDLE HYPO 22X1.5 SAFETY MO (MISCELLANEOUS) ×2 IMPLANT
NS IRRIG 1000ML POUR BTL (IV SOLUTION) ×2 IMPLANT
PACK BASIN DAY SURGERY FS (CUSTOM PROCEDURE TRAY) ×2 IMPLANT
PENCIL SMOKE EVACUATOR (MISCELLANEOUS) ×2 IMPLANT
SHEET MEDIUM DRAPE 40X70 STRL (DRAPES) IMPLANT
SLEEVE SCD COMPRESS KNEE MED (STOCKING) ×2 IMPLANT
SPIKE FLUID TRANSFER (MISCELLANEOUS) ×2 IMPLANT
SYR BULB IRRIG 60ML STRL (SYRINGE) ×2 IMPLANT
SYR CONTROL 10ML LL (SYRINGE) ×2 IMPLANT
TOWEL GREEN STERILE FF (TOWEL DISPOSABLE) ×2 IMPLANT
TRAY DSU PREP LF (CUSTOM PROCEDURE TRAY) ×2 IMPLANT
TUBE CONNECTING 20X1/4 (TUBING) ×2 IMPLANT
YANKAUER SUCT BULB TIP NO VENT (SUCTIONS) ×2 IMPLANT

## 2023-11-08 NOTE — Op Note (Signed)
 11/08/2023  7:57 AM  PATIENT:  Clarence Simpson  38 y.o. adult  Patient Care Team: Patient, No Pcp Per as PCP - General (General Practice)  PRE-OPERATIVE DIAGNOSIS:  LGSIL PAP SMEAR OF ANUS  POST-OPERATIVE DIAGNOSIS:  LGSIL PAP SMEAR OF ANUS  PROCEDURE: HIGH RESOLUTION ANOSCOPY BIOPSY/ABLATION    Surgeon(s): Romie Levee, MD  ASSISTANT: none   ANESTHESIA:   local and MAC  EBL: 5ml   SPECIMEN:  Source of Specimen:  R posterior anal canal, L anterior anal condyloma  DISPOSITION OF SPECIMEN:  PATHOLOGY  COUNTS:  YES  PLAN OF CARE: Discharge to home after PACU  PATIENT DISPOSITION:  PACU - hemodynamically stable.  INDICATION: 38 y.o. 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 MAC anesthesia was smoothly induced.  The patient was then prepped and draped in the usual sterile fashion. A surgical timeout was performed indicating the correct patient, procedure, positioning and preoperative antibioitics. SCDs were noted to be in place and functioning prior to the operation.   After this was completed, a sponge was soaked in 5% acetic acid was placed over the perianal region. This was allowed to soak for 2 minutes. The sponge was removed and the perianal region was evaluated with a colposcope.  There were no external lesions noted.  The internal anal canal was evaluated via anoscopy with a Hill-Ferguson anoscope.  There were 2 areas of concern.  Acetowhite staining in the R posterior anal canal and a 4mm condyloma in the L anterior anal canal.  Representative biopsies were taken of each and the area was ablated.  After this was completed, hemostasis was achieved with electrocautery.  A sterile dressing was applied over  this. The patient was then awakened from anesthesia and sent to the postanesthesia care unit in stable condition. All counts were correct operating room staff.   Vanita Panda, MD  Colorectal and General Surgery [facility]

## 2023-11-08 NOTE — Discharge Instructions (Addendum)
 Beginning the day after surgery:  You may sit in a tub of warm water 2-3 times a day to relieve discomfort.  Eat a regular diet high in fiber.  Avoid foods that give you constipation or diarrhea.  Avoid foods that are difficult to digest, such as seeds, nuts, corn or popcorn.  Do not go any longer than 2 days without a bowel movement.  You may take a dose of Milk of Magnesia if you become constipated.    Drink 6-8 glasses of water daily.  Walking is encouraged.  Avoid strenuous activity and heavy lifting for one month after surgery.    Call the office if you have any questions or concerns.  Call immediately if you develop:  Excessive rectal bleeding (more than a cup or passing large clots) Increased discomfort Fever greater than 100 F Difficulty urinating   No Tylenol until 1:00pm

## 2023-11-08 NOTE — H&P (Addendum)
    ERRING PHYSICIAN:  Daiva Eves, [address]*   PROVIDER:  Elenora Gamma, MD   MRN: [id number] DOB: 05-07-1986    Subjective    Chief Complaint: New Consultation       History of Present Illness: Clarence Simpson is a 38 y.o. male who is seen today as an office consultation at the request of Dr. Daiva Eves for evaluation of New Consultation .  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  Has a h/o anal warts, s/p fulguration in 2014.     Review of Systems: A complete review of systems was obtained from the patient.  I have reviewed this information and discussed as appropriate with the patient.  See HPI as well for other ROS.     Medical History:     Past Medical History:  Diagnosis Date   Asthma, unspecified asthma severity, unspecified whether complicated, unspecified whether persistent (HHS-HCC)     HIV -AIDS with opportunistic infection, Symptomatic (CMS/HHS-HCC)        There is no problem list on file for this patient.     History reviewed. No pertinent surgical history.    No Known Allergies   No current outpatient medications on file prior to visit.    No current facility-administered medications on file prior to visit.           Family History  Problem Relation Age of Onset   Skin cancer Mother     Coronary Artery Disease (Blocked arteries around heart) Mother     Deep vein thrombosis (DVT or abnormal blood clot formation) Mother        Social History        Tobacco Use  Smoking Status Some Days   Types: Cigarettes  Smokeless Tobacco Never      Social History         Socioeconomic History   Marital status: Single  Tobacco Use   Smoking status: Some Days      Types: Cigarettes   Smokeless tobacco: Never  Substance and Sexual Activity   Alcohol use: Yes   Drug use: Never      Objective:      Today's Vitals   11/01/23 1155 11/08/23 0635  BP:  109/70  Pulse:  70  Resp:  20  Temp:  98.9 F (37.2 C)   TempSrc:  Temporal  SpO2:  100%  Weight: 77.8 kg 82.6 kg  Height: 6\' 2"  (1.88 m) 6\' 2"  (1.88 m)  PainSc:  0-No pain   Body mass index is 23.38 kg/m.  Exam Gen: NAD CV: RRR Pulm: CTA Abd: soft     Labs, Imaging and Diagnostic Testing: HIV: ND CD4 876 Assessment and Plan:  Diagnoses and all orders for this visit:   LGSIL Pap smear of anus     We discussed that this is usually due to anal warts.  We discussed the procedure of high-resolution anoscopy that can be done to help identify and destroy these areas.  We discussed that typical postprocedural side effects are pain and occasional bleeding.  These tend to resolve within 1 to 2 weeks.  Patient would like to proceed.   Vanita Panda, MD Colon and Rectal Surgery [facility]

## 2023-11-08 NOTE — Anesthesia Preprocedure Evaluation (Addendum)
 Anesthesia Evaluation  Patient identified by MRN, date of birth, ID band Patient awake    Reviewed: Allergy & Precautions, H&P , NPO status , Patient's Chart, lab work & pertinent test results  Airway Mallampati: II  TM Distance: >3 FB Neck ROM: Full    Dental no notable dental hx. (+) Teeth Intact, Dental Advisory Given   Pulmonary asthma , Current Smoker   Pulmonary exam normal breath sounds clear to auscultation       Cardiovascular negative cardio ROS  Rhythm:Regular Rate:Normal     Neuro/Psych  Headaches  negative psych ROS   GI/Hepatic negative GI ROS, Neg liver ROS,,,  Endo/Other  negative endocrine ROS    Renal/GU negative Renal ROS  negative genitourinary   Musculoskeletal   Abdominal   Peds  Hematology  (+) HIV  Anesthesia Other Findings   Reproductive/Obstetrics negative OB ROS                             Anesthesia Physical Anesthesia Plan  ASA: 2  Anesthesia Plan: MAC   Post-op Pain Management: Tylenol PO (pre-op)*   Induction: Intravenous  PONV Risk Score and Plan: 1 and Propofol infusion, Midazolam, Ondansetron and Dexamethasone  Airway Management Planned: Natural Airway and Simple Face Mask  Additional Equipment:   Intra-op Plan:   Post-operative Plan:   Informed Consent: I have reviewed the patients History and Physical, chart, labs and discussed the procedure including the risks, benefits and alternatives for the proposed anesthesia with the patient or authorized representative who has indicated his/her understanding and acceptance.     Dental advisory given  Plan Discussed with: CRNA  Anesthesia Plan Comments:        Anesthesia Quick Evaluation

## 2023-11-08 NOTE — Anesthesia Postprocedure Evaluation (Signed)
 Anesthesia Post Note  Patient: Clarence Simpson  Procedure(s) Performed: HIGH RESOLUTION ANOSCOPY (Rectum) BIOPSY/ABLATION     Patient location during evaluation: PACU Anesthesia Type: MAC Level of consciousness: awake and alert Pain management: pain level controlled Vital Signs Assessment: post-procedure vital signs reviewed and stable Respiratory status: spontaneous breathing, nonlabored ventilation and respiratory function stable Cardiovascular status: stable and blood pressure returned to baseline Postop Assessment: no apparent nausea or vomiting Anesthetic complications: no  No notable events documented.  Last Vitals:  Vitals:   11/08/23 0830 11/08/23 0845  BP: 121/78   Pulse: 63   Resp: 15 16  Temp:  (!) 36.1 C  SpO2: 100% 100%    Last Pain:  Vitals:   11/08/23 0845  TempSrc:   PainSc: 0-No pain                 Kaiven Vester,W. EDMOND

## 2023-11-08 NOTE — Transfer of Care (Signed)
 Immediate Anesthesia Transfer of Care Note  Patient: Clarence Simpson  Procedure(s) Performed: HIGH RESOLUTION ANOSCOPY (Rectum) BIOPSY/ABLATION  Patient Location: PACU  Anesthesia Type:MAC  Level of Consciousness: awake  Airway & Oxygen Therapy: Patient Spontanous Breathing and Patient connected to face mask oxygen  Post-op Assessment: Report given to RN and Post -op Vital signs reviewed and stable  Post vital signs: Reviewed and stable  Last Vitals:  Vitals Value Taken Time  BP    Temp    Pulse    Resp    SpO2      Last Pain:  Vitals:   11/08/23 0635  TempSrc: Temporal  PainSc: 0-No pain         Complications: No notable events documented.

## 2023-11-09 ENCOUNTER — Encounter (HOSPITAL_BASED_OUTPATIENT_CLINIC_OR_DEPARTMENT_OTHER): Payer: Self-pay | Admitting: General Surgery

## 2023-11-11 LAB — SURGICAL PATHOLOGY

## 2023-11-12 ENCOUNTER — Other Ambulatory Visit: Payer: Self-pay

## 2023-11-12 ENCOUNTER — Encounter: Payer: Medicaid Other | Admitting: *Deleted

## 2023-11-12 DIAGNOSIS — Z006 Encounter for examination for normal comparison and control in clinical research program: Secondary | ICD-10-CM

## 2023-11-12 MED ORDER — STUDY - MK-8591A-053 - BICTEGRAVIR/EMTRICITABINE/TENOFOVIR ALAFENAMIDE (BIKTARVY) 50-200-25 MG OR PLACEBO TABLET (PI-VAN DAM): ORAL_TABLET | ORAL | 0 refills | Status: DC

## 2023-11-12 MED ORDER — STUDY - MK-8591A-053 - MK-8591A 100/0.25 MG OR PLACEBO TABLET (PI-VAN DAM): ORAL_TABLET | ORAL | 0 refills | Status: DC

## 2023-11-13 NOTE — Research (Unsigned)
 Clarence Simpson seen today for her week 96 visit for [id number] study, A Phase 3, Randomized, Active-Controlled, Double-Blind Clinical Study to Evaluate the Antiretroviral Activity, Safety, and Tolerability of Doravirine/Islatravir (DOR/ISL 100 mg/0.25 mg) Once-Daily in HIV-1 Infected Treatment-Nave Participants. All procedures completed per protocol. Study IP was dispensed. She will return in June for her next study visit.

## 2024-02-05 ENCOUNTER — Encounter: Admitting: *Deleted

## 2024-02-13 ENCOUNTER — Encounter: Admitting: *Deleted

## 2024-02-13 ENCOUNTER — Other Ambulatory Visit: Payer: Self-pay

## 2024-02-13 DIAGNOSIS — Z006 Encounter for examination for normal comparison and control in clinical research program: Secondary | ICD-10-CM

## 2024-02-13 MED ORDER — STUDY - MK-8591A-053 - BICTEGRAVIR/EMTRICITABINE/TENOFOVIR ALAFENAMIDE (BIKTARVY) 50-200-25 MG OR PLACEBO TABLET (PI-VAN DAM)
ORAL_TABLET | ORAL | 0 refills | Status: DC
Start: 2024-02-13 — End: 2024-04-28

## 2024-02-13 MED ORDER — STUDY - MK-8591A-053 - MK-8591A 100/0.25 MG OR PLACEBO TABLET (PI-VAN DAM): ORAL_TABLET | ORAL | 0 refills | Status: DC

## 2024-02-13 NOTE — Research (Signed)
 Clarence Simpson seen today for her week 108 visit for [id number] study, A Phase 3, Randomized, Active-Controlled, Double-Blind Clinical Study to Evaluate the Antiretroviral Activity, Safety, and Tolerability of Doravirine/Islatravir (DOR/ISL 100 mg/0.25 mg) Once-Daily in HIV-1 Infected Treatment-Nave Participants. Reconsent required. I explained/reviewed reason for reconsent. Risk, responsibilities, benefits and orther options were reviewed. Verbalized understanding and denied any questions at this time. Signed consent was obtained and witnessed by me. Informed consent was obtained prior to any procedures being done. All procedures completed per protocol. Study IP was dispensed. She will return in September for her next study visit.

## 2024-03-06 ENCOUNTER — Encounter

## 2024-03-13 ENCOUNTER — Encounter: Admitting: *Deleted

## 2024-03-13 ENCOUNTER — Other Ambulatory Visit: Payer: Self-pay

## 2024-03-13 DIAGNOSIS — Z006 Encounter for examination for normal comparison and control in clinical research program: Secondary | ICD-10-CM

## 2024-03-13 NOTE — Research (Signed)
 Clarence Simpson seen today for her viremia confirmation visit for [id number] study, A Phase 3, Randomized, Active-Controlled, Double-Blind Clinical Study to Evaluate the Antiretroviral Activity, Safety, and Tolerability of Doravirine/Islatravir (DOR/ISL 100 mg/0.25 mg) Once-Daily in HIV-1 Infected Treatment-Nave Participants. All procedures completed per protocol. Excellent adherence per pill count. She is scheduled to return in September for her next study visit.

## 2024-03-30 ENCOUNTER — Other Ambulatory Visit: Payer: Self-pay

## 2024-03-30 ENCOUNTER — Encounter (HOSPITAL_BASED_OUTPATIENT_CLINIC_OR_DEPARTMENT_OTHER): Payer: Self-pay | Admitting: Emergency Medicine

## 2024-03-30 ENCOUNTER — Emergency Department (HOSPITAL_BASED_OUTPATIENT_CLINIC_OR_DEPARTMENT_OTHER)
Admission: EM | Admit: 2024-03-30 | Discharge: 2024-03-30 | Disposition: A | Discharge status: Home / Self Care | Attending: Emergency Medicine | Admitting: Emergency Medicine

## 2024-03-30 DIAGNOSIS — J45909 Unspecified asthma, uncomplicated: Secondary | ICD-10-CM | POA: Diagnosis not present

## 2024-03-30 DIAGNOSIS — K029 Dental caries, unspecified: Secondary | ICD-10-CM | POA: Diagnosis not present

## 2024-03-30 DIAGNOSIS — Z21 Asymptomatic human immunodeficiency virus [HIV] infection status: Secondary | ICD-10-CM | POA: Diagnosis not present

## 2024-03-30 DIAGNOSIS — Z7982 Long term (current) use of aspirin: Secondary | ICD-10-CM | POA: Diagnosis not present

## 2024-03-30 DIAGNOSIS — Z79899 Other long term (current) drug therapy: Secondary | ICD-10-CM | POA: Insufficient documentation

## 2024-03-30 DIAGNOSIS — Z7951 Long term (current) use of inhaled steroids: Secondary | ICD-10-CM | POA: Diagnosis not present

## 2024-03-30 DIAGNOSIS — R519 Headache, unspecified: Secondary | ICD-10-CM | POA: Insufficient documentation

## 2024-03-30 DIAGNOSIS — K0889 Other specified disorders of teeth and supporting structures: Secondary | ICD-10-CM

## 2024-03-30 MED ORDER — KETOROLAC TROMETHAMINE 15 MG/ML IJ SOLN
15.0000 mg | Freq: Once | INTRAMUSCULAR | Status: AC
  Administered 2024-03-30: 15 mg via INTRAVENOUS
  Filled 2024-03-30: qty 1

## 2024-03-30 MED ORDER — LIDOCAINE VISCOUS HCL 2 % MT SOLN
15.0000 mL | Freq: Once | OROMUCOSAL | Status: AC
  Administered 2024-03-30: 15 mL via OROMUCOSAL
  Filled 2024-03-30: qty 15

## 2024-03-30 MED ORDER — LIDOCAINE VISCOUS HCL 2 % MT SOLN: 5.0000 mL | Freq: Three times a day (TID) | OROMUCOSAL | 0 refills | Status: AC

## 2024-03-30 MED ORDER — AMOXICILLIN-POT CLAVULANATE 875-125 MG PO TABS
1.0000 | ORAL_TABLET | Freq: Two times a day (BID) | ORAL | 0 refills | Status: DC
Start: 2024-03-30 — End: 2024-04-10

## 2024-03-30 MED ORDER — MAGIC MOUTHWASH: 5.0000 mL | Freq: Two times a day (BID) | ORAL | 0 refills | Status: AC

## 2024-03-30 MED ORDER — SODIUM CHLORIDE 0.9 % IV BOLUS
1000.0000 mL | Freq: Once | INTRAVENOUS | Status: AC
  Administered 2024-03-30: 1000 mL via INTRAVENOUS

## 2024-03-30 MED ORDER — METOCLOPRAMIDE HCL 5 MG/ML IJ SOLN
5.0000 mg | Freq: Once | INTRAMUSCULAR | Status: AC
  Administered 2024-03-30: 5 mg via INTRAVENOUS
  Filled 2024-03-30: qty 2

## 2024-03-30 MED ORDER — DIPHENHYDRAMINE HCL 50 MG/ML IJ SOLN
12.5000 mg | Freq: Once | INTRAMUSCULAR | Status: AC
  Administered 2024-03-30: 12.5 mg via INTRAVENOUS
  Filled 2024-03-30: qty 1

## 2024-03-30 MED ORDER — CHLORHEXIDINE GLUCONATE 0.12 % MT SOLN: 15.0000 mL | Freq: Two times a day (BID) | OROMUCOSAL | 0 refills | Status: AC

## 2024-03-30 MED ORDER — LIDOCAINE VISCOUS HCL 2 % MT SOLN: 5.0000 mL | Freq: Three times a day (TID) | OROMUCOSAL | 0 refills | Status: DC

## 2024-03-30 NOTE — ED Provider Notes (Signed)
 [place] EMERGENCY DEPARTMENT AT [facility] Provider Note   CSN: [id number] Arrival date & time: 03/30/24  1048     Patient presents with: Migraine   Clarence Simpson is a 38 y.o. adult.  Patient with past medical history of HIV, asthma, headaches presents to emergency room with complaint of headache.  Patient reports that he has headaches frequently but what brought him in today was right tooth pain.  He reports had this pain before and he has noticed some swelling to his right upper molar.  Reports that he has needed antibiotic for this in the past.  Has been taking extra Ativan at home for pain control.  Denies fever or chill.  He is handling secretion as normal phonation.  No trauma or fall in relation to headache.  Feels like typical headache.  No focal neurological symptoms.    Migraine Associated symptoms include headaches.       Prior to Admission medications   Medication Sig Start Date End Date Taking? Authorizing Provider  amoxicillin -clavulanate (AUGMENTIN ) 875-125 MG tablet Take 1 tablet by mouth every 12 (twelve) hours. 03/30/24  Yes Shivonne Schwartzman, Warren SAILOR, PA-C  chlorhexidine  (PERIDEX ) 0.12 % solution Use as directed 15 mLs in the mouth or throat 2 (two) times daily. 03/30/24  Yes Maximus Hoffert, Warren SAILOR, PA-C  albuterol  (VENTOLIN  HFA) 108 (90 Base) MCG/ACT inhaler Inhale 2 puffs into the lungs every 6 (six) hours as needed for wheezing or shortness of breath. 01/10/22   Fleeta Kathie Jomarie SAILOR, MD  aspirin -acetaminophen -caffeine (EXCEDRIN MIGRAINE) 250-250-65 MG tablet Take 2 tablets by mouth every 6 (six) hours as needed for headache.    [provider]  hydrocortisone  cream 1 % Apply 1 Application topically 2 (two) times daily. Apply to affected area Patient not taking: Reported on 11/13/2023 07/16/23   Efrain Lamar ORN, MD  ibuprofen  (ADVIL ) 800 MG tablet Take 1 tablet (800 mg total) by mouth every 8 (eight) hours as needed (Dental pain). Patient not taking: Reported on  11/13/2023 06/19/23   Mayer, Jodi R, NP  Study [phone] - bictegravir/emtrictabine/tenofovir  alafenamide (BIKTARVY ) 50-200-25 mg or placebo tablet [facility]) For Investigational Drug Use Only. Take 1 tablet my mouth daily with water . Take at the same time every day. Please bring back all bottles to study coordinator at your next visit. Please contact [facility] Research regarding any question about this medication. 02/13/24   Fleeta Kathie Jomarie SAILOR, MD  Study [phone] - [id number] 100/0.25 mg or placebo tablet [facility]) For Investigational Drug Use Only. Take 1 tablet my mouth daily with water . Take at the same time every day. Please bring back all bottles to study coordinator at your next visit. Please contact [facility] Research regarding any question about this medication. 02/13/24   Fleeta Kathie Jomarie SAILOR, MD  traMADol  (ULTRAM ) 50 MG tablet Take 1 tablet (50 mg total) by mouth every 6 (six) hours as needed. Patient not taking: Reported on 11/13/2023 11/08/23   Debby Hila, MD    Allergies: Patient has no known allergies.    Review of Systems  Neurological:  Positive for headaches.    Updated Vital Signs BP 119/87   Pulse 91   Temp 98.7 F (37.1 C)   Resp 17   SpO2 98%   Physical Exam Vitals and nursing note reviewed.  Constitutional:      General: She is not in acute distress.    Appearance: She is not toxic-appearing.  HENT:     Head: Normocephalic and atraumatic.  Mouth/Throat:     Dentition: Abnormal dentition. Gingival swelling and dental caries present.   Eyes:     General: No scleral icterus.    Conjunctiva/sclera: Conjunctivae normal.  Cardiovascular:     Rate and Rhythm: Normal rate and regular rhythm.     Pulses: Normal pulses.     Heart sounds: Normal heart sounds.  Pulmonary:     Effort: Pulmonary effort is normal. No respiratory distress.     Breath sounds: Normal breath sounds.  Abdominal:     General: Abdomen is flat. Bowel sounds are normal.      Palpations: Abdomen is soft.     Tenderness: There is no abdominal tenderness.  Skin:    General: Skin is warm and dry.     Findings: No lesion.  Neurological:     General: No focal deficit present.     Mental Status: She is alert and oriented to person, place, and time. Mental status is at baseline.     Comments: Alert and oriented answering questions appropriately with no slurred speech.  No nystagmus.  Pupils equal and reactive.  No facial droop. No ataxia with gait.  Upper extremity sensation and strength intact.  Lower extremity sensation and strength intact.     (all labs ordered are listed, but only abnormal results are displayed) Labs Reviewed - No data to display  EKG: None  Radiology: No results found.   Procedures   Medications Ordered in the ED  sodium chloride  0.9 % bolus 1,000 mL (1,000 mLs Intravenous New Bag/Given 03/30/24 1429)  ketorolac  (TORADOL ) 15 MG/ML injection 15 mg (15 mg Intravenous Given 03/30/24 1434)  metoCLOPramide  (REGLAN ) injection 5 mg (5 mg Intravenous Given 03/30/24 1435)  diphenhydrAMINE  (BENADRYL ) injection 12.5 mg (12.5 mg Intravenous Given 03/30/24 1434)  lidocaine  (XYLOCAINE ) 2 % viscous mouth solution 15 mL (15 mLs Mouth/Throat Given 03/30/24 1435)                                    Medical Decision Making Risk Prescription drug management.   Yosmar Ryker 38 y.o. presented today for HA. Working Ddx: tension headache, migraine, intracranial mass, intracranial hemorrhage, intracranial infection including meningitis vs encephalitis, trigeminal neuralgia, AVM, sinusitis, cerebral aneurysm, muscular headache, cavernous sinus thrombosis, carotid artery dissection.  R/o DDx:  intracranial mass, hemorrhage,or infection including meningitis vs encephalitis, trigeminal neuralgia, AVM, cerebral aneurysm, muscular headache, cavernous sinus thrombosis, carotid artery dissection are less likely due to history of present illness, physical exam,  labs/imaging findings  PMHX: HA, HIV  Review of prior external notes: Did have a headache 12/11/2022 in which patient had CT scan which showed normal head CT.  Problem List / ED Course / Critical interventions / Medication management  Patient reports with complaint of intermittent headache.  Feels like typical headache but exacerbated by right tooth pain.  Patient denies injury trauma or fall.  Patient with has nonfocal neuroexam.  Patient is overall stable and well-appearing.  Symptoms consistent with typical headache do not feel repeat head CT is needed today.  Will treat headache with migraine cocktail.  Patient also endorses coming in secondary to dental pain.  Patient with overall poor dentition.  No obvious dental abscess requiring incision and drainage today.  Uvula is midline.  Patient is handling secretions with normal phonation.  No anterior neck swelling or anterior neck pain.  Will try mouthwash, dental follow-up and Augmentin . I ordered medication including migraine  cocktail.  Reevaluation of the patient after these medicines showed that the patient improved Patients vitals assessed. Upon arrival patient is  hemodynamically stable.  I have reviewed the patients home medicines and have made adjustments as needed  Plan: F/u w/ PCP in 2-3d to ensure resolution of sx.  Patient was given return precautions. Patient stable for discharge at this time.  Patient educated on sx/dx and verbalized understanding of plan. Return to ED if new or worsening sx.        Final diagnoses:  Bad headache  Tooth ache    ED Discharge Orders          Ordered    amoxicillin -clavulanate (AUGMENTIN ) 875-125 MG tablet  Every 12 hours        03/30/24 1307    chlorhexidine  (PERIDEX ) 0.12 % solution  2 times daily        03/30/24 1307    magic mouthwash SOLN  2 times daily        03/30/24 1447    magic mouthwash (lidocaine , diphenhydrAMINE , alum & mag hydroxide) suspension  3 times daily         03/30/24 1447               Sadeen Wiegel, Warren SAILOR, PA-C 03/30/24 1449    Doretha Folks, MD 03/30/24 1507

## 2024-03-30 NOTE — ED Notes (Signed)
 RN reviewed discharge instructions with pt. Pt verbalized understanding and had no further questions. VSS upon discharge.

## 2024-03-30 NOTE — Discharge Instructions (Addendum)
 Please take antibiotic as prescribed for toothache.  You need to see a dentist.  Please call to schedule appointment.  Return to emergency room with new or worsening symptoms including difficulty speaking or swallowing.

## 2024-03-30 NOTE — ED Triage Notes (Signed)
 Pt c/o migraine and R facial pain for about a month. Taken Excedrin for pain

## 2024-03-31 ENCOUNTER — Other Ambulatory Visit: Payer: Self-pay

## 2024-03-31 ENCOUNTER — Emergency Department (HOSPITAL_BASED_OUTPATIENT_CLINIC_OR_DEPARTMENT_OTHER)

## 2024-03-31 ENCOUNTER — Emergency Department (HOSPITAL_BASED_OUTPATIENT_CLINIC_OR_DEPARTMENT_OTHER)
Admission: EM | Admit: 2024-03-31 | Discharge: 2024-03-31 | Disposition: A | Discharge status: Home / Self Care | Attending: Emergency Medicine | Admitting: Emergency Medicine

## 2024-03-31 ENCOUNTER — Encounter (HOSPITAL_BASED_OUTPATIENT_CLINIC_OR_DEPARTMENT_OTHER): Payer: Self-pay | Admitting: Emergency Medicine

## 2024-03-31 DIAGNOSIS — G43909 Migraine, unspecified, not intractable, without status migrainosus: Secondary | ICD-10-CM | POA: Insufficient documentation

## 2024-03-31 DIAGNOSIS — Z21 Asymptomatic human immunodeficiency virus [HIV] infection status: Secondary | ICD-10-CM | POA: Diagnosis not present

## 2024-03-31 DIAGNOSIS — J45909 Unspecified asthma, uncomplicated: Secondary | ICD-10-CM | POA: Diagnosis not present

## 2024-03-31 DIAGNOSIS — R519 Headache, unspecified: Secondary | ICD-10-CM | POA: Diagnosis present

## 2024-03-31 MED ORDER — PROCHLORPERAZINE EDISYLATE 10 MG/2ML IJ SOLN
10.0000 mg | Freq: Once | INTRAMUSCULAR | Status: AC
  Administered 2024-03-31: 10 mg via INTRAVENOUS
  Filled 2024-03-31: qty 2

## 2024-03-31 MED ORDER — DIPHENHYDRAMINE HCL 50 MG/ML IJ SOLN
12.5000 mg | Freq: Once | INTRAMUSCULAR | Status: AC
  Administered 2024-03-31: 12.5 mg via INTRAVENOUS
  Filled 2024-03-31: qty 1

## 2024-03-31 MED ORDER — SODIUM CHLORIDE 0.9 % IV BOLUS
1000.0000 mL | Freq: Once | INTRAVENOUS | Status: AC
  Administered 2024-03-31: 1000 mL via INTRAVENOUS

## 2024-03-31 MED ORDER — KETOROLAC TROMETHAMINE 15 MG/ML IJ SOLN
15.0000 mg | Freq: Once | INTRAMUSCULAR | Status: AC
  Administered 2024-03-31: 15 mg via INTRAVENOUS
  Filled 2024-03-31: qty 1

## 2024-03-31 NOTE — Discharge Instructions (Addendum)
 Please follow-up outpatient with neurology regarding your headache, return for any severe worsening symptoms

## 2024-03-31 NOTE — ED Provider Notes (Signed)
 [place] EMERGENCY DEPARTMENT AT [facility] Provider Note   CSN: [id number] Arrival date & time: 03/31/24  0807     Patient presents with: Headache   Clarence Simpson is a 38 y.o. adult with a past medical history significant for HIV, asthma, and history of headaches who presents to the ED due to persistent right sided headache x 1 month.  Patient notes she has taken over 100 Excedrin over the past month for headache. Headache has been daily. Denies fever and chills.  Denies visual changes, speech changes, and unilateral weakness.  Patient was seen in the ED yesterday for same complaint and diagnosed with a dental infection where she was prescribed antibiotics.  Patient's main concern today is her right sided headache.  No recent head injury.  Denies neck stiffness.  No rash.  Patient has been compliant with her HIV medications and sees infectious disease regularly per patient.  She also admits to right sided otalgia.  No hearing loss.  No drainage.  Has not seen a dentist in numerous years.  Has been compliant with her antibiotics prescribed yesterday for dental infection.  History obtained from patient and past medical records. No interpreter used during encounter.       Prior to Admission medications   Medication Sig Start Date End Date Taking? Authorizing Provider  albuterol  (VENTOLIN  HFA) 108 (90 Base) MCG/ACT inhaler Inhale 2 puffs into the lungs every 6 (six) hours as needed for wheezing or shortness of breath. 01/10/22   Fleeta Kathie Jomarie LOISE, MD  amoxicillin -clavulanate (AUGMENTIN ) 875-125 MG tablet Take 1 tablet by mouth every 12 (twelve) hours. 03/30/24   Barrett, Jamie N, PA-C  aspirin -acetaminophen -caffeine (EXCEDRIN MIGRAINE) 250-250-65 MG tablet Take 2 tablets by mouth every 6 (six) hours as needed for headache.    [provider]  chlorhexidine  (PERIDEX ) 0.12 % solution Use as directed 15 mLs in the mouth or throat 2 (two) times daily. 03/30/24   Barrett, Jamie N,  PA-C  hydrocortisone  cream 1 % Apply 1 Application topically 2 (two) times daily. Apply to affected area Patient not taking: Reported on 11/13/2023 07/16/23   Efrain Lamar ORN, MD  ibuprofen  (ADVIL ) 800 MG tablet Take 1 tablet (800 mg total) by mouth every 8 (eight) hours as needed (Dental pain). Patient not taking: Reported on 11/13/2023 06/19/23   Mayer, Jodi R, NP  magic mouthwash (lidocaine , diphenhydrAMINE , alum & mag hydroxide) suspension Swish and spit 5 mLs 3 (three) times daily. 03/30/24   Barrett, Warren LOISE, PA-C  magic mouthwash SOLN Take 5 mLs by mouth 2 (two) times daily for 10 days. Suspension contains equal amounts of Maalox Extra Strength, nystatin, and diphenhydramine . 03/30/24 04/09/24  Barrett, Jamie N, PA-C  Study - [id number] - bictegravir/emtrictabine/tenofovir  alafenamide (BIKTARVY ) 50-200-25 mg or placebo tablet ([facility]) For Investigational Drug Use Only. Take 1 tablet my mouth daily with water . Take at the same time every day. Please bring back all bottles to study coordinator at your next visit. Please contact [facility] regarding any question about this medication. 02/13/24   Fleeta Kathie Jomarie LOISE, MD  Study [phone] - FX-1408J 100/0.25 mg or placebo tablet [facility]) For Investigational Drug Use Only. Take 1 tablet my mouth daily with water . Take at the same time every day. Please bring back all bottles to study coordinator at your next visit. Please contact [facility] regarding any question about this medication. 02/13/24   Fleeta Kathie Jomarie LOISE, MD  traMADol  (ULTRAM ) 50 MG tablet Take 1 tablet (  50 mg total) by mouth every 6 (six) hours as needed. Patient not taking: Reported on 11/13/2023 11/08/23   Debby Hila, MD    Allergies: Patient has no known allergies.    Review of Systems  Constitutional:  Negative for chills and fever.  Eyes:  Negative for visual disturbance.  Skin:  Negative for rash.  Neurological:  Positive for headaches. Negative for facial  asymmetry, speech difficulty and weakness.  All other systems reviewed and are negative.   Updated Vital Signs BP 110/81 (BP Location: Right Arm)   Pulse 67   Temp 98.7 F (37.1 C) (Oral)   Resp 18   SpO2 100%   Physical Exam Vitals and nursing note reviewed.  Constitutional:      General: She is not in acute distress.    Appearance: She is not ill-appearing.  HENT:     Head: Normocephalic.     Right Ear: Tympanic membrane normal.     Left Ear: Tympanic membrane normal.  Eyes:     Pupils: Pupils are equal, round, and reactive to light.  Neck:     Comments: No meningismus. Cardiovascular:     Rate and Rhythm: Normal rate and regular rhythm.     Pulses: Normal pulses.     Heart sounds: Normal heart sounds. No murmur heard.    No friction rub. No gallop.  Pulmonary:     Effort: Pulmonary effort is normal.     Breath sounds: Normal breath sounds.  Abdominal:     General: Abdomen is flat. There is no distension.     Palpations: Abdomen is soft.     Tenderness: There is no abdominal tenderness. There is no guarding or rebound.  Musculoskeletal:        General: Normal range of motion.     Cervical back: Neck supple.  Skin:    General: Skin is warm and dry.  Neurological:     General: No focal deficit present.     Mental Status: She is alert.     Comments: Speech is clear, able to follow commands CN III-XII intact Normal strength in upper and lower extremities bilaterally including dorsiflexion and plantar flexion, strong and equal grip strength Sensation grossly intact throughout Moves extremities without ataxia, coordination intact No pronator drift Ambulates without difficulty  Psychiatric:        Mood and Affect: Mood normal.        Behavior: Behavior normal.     (all labs ordered are listed, but only abnormal results are displayed) Labs Reviewed - No data to display  EKG: None  Radiology: CT Head Wo Contrast Result Date: 03/31/2024 CLINICAL DATA:   Provided history: Headache, increasing frequency or severity. EXAM: CT HEAD WITHOUT CONTRAST TECHNIQUE: Contiguous axial images were obtained from the base of the skull through the vertex without intravenous contrast. RADIATION DOSE REDUCTION: This exam was performed according to the departmental dose-optimization program which includes automated exposure control, adjustment of the mA and/or kV according to patient size and/or use of iterative reconstruction technique. COMPARISON:  Head CT 12/11/2022. FINDINGS: Brain: Cerebral volume is normal. There is no acute intracranial hemorrhage. No demarcated cortical infarct. No extra-axial fluid collection. No evidence of an intracranial mass. No midline shift. Vascular: No hyperdense vessel. Skull: No calvarial fracture or aggressive osseous lesion. Sinuses/Orbits: No mass or acute finding within the imaged orbits. Mild mucosal thickening within the bilateral frontal, bilateral ethmoid and right maxillary sinuses at the imaged levels. IMPRESSION: 1.  No evidence of an  acute intracranial abnormality. 2. Mild paranasal sinus mucosal thickening at the imaged levels. Electronically Signed   By: Rockey Childs D.O.   On: 03/31/2024 11:11     Procedures   Medications Ordered in the ED  prochlorperazine  (COMPAZINE ) injection 10 mg (10 mg Intravenous Given 03/31/24 0937)  diphenhydrAMINE  (BENADRYL ) injection 12.5 mg (12.5 mg Intravenous Given 03/31/24 0937)  sodium chloride  0.9 % bolus 1,000 mL (0 mLs Intravenous Stopped 03/31/24 1106)  ketorolac  (TORADOL ) 15 MG/ML injection 15 mg (15 mg Intravenous Given 03/31/24 1247)    Clinical Course as of 03/31/24 1326  Tue Mar 31, 2024  1020 Reassessed patient at bedside.  Patient with some improvement in pain however, headache still present.  CT results pending.  If unremarkable will give Toradol . [CA]  1133 Reassessed patient at bedside.  Still slight headache.  CT head negative.  IV Toradol  given. [CA]  1323 Reassessed patient.   She admits to improvement in pain after Toradol .  Patient stable for discharge. [CA]    Clinical Course User Index [CA] Lorelle Aleck BROCKS, PA-C                                 Medical Decision Making Amount and/or Complexity of Data Reviewed Radiology: ordered and independent interpretation performed. Decision-making details documented in ED Course.  Risk Prescription drug management.    This patient presents to the ED for concern of headache, this involves an extensive number of treatment options, and is a complaint that carries with it a high risk of complications and morbidity.  The differential diagnosis includes migraine, meningitis, dental infection, etc  38 year old male presents to the ED due to persistent right-sided headache x 1 month.  Seen yesterday for same complaint and diagnosed with a dental infection where she was prescribed antibiotics which she has been compliant with.  History of HIV which is well-controlled per patient.  She has been compliant with HIV medication.  No fever or chills.  No rash.  Upon arrival, vitals all within normal limits.  Patient well-appearing on exam.  Reassuring physical exam.  No meningismus to suggest meningitis.  Normal neurological exam without any neurological deficits.  Right ear with normal TM.  Low suspicion for acute otitis media.  No rash to suggest shingles.  Patient does note she has taken over 100 Excedrin over the past month for headache.  Possible rebound headaches?  Will obtain CT scan given persistent headache.  Migraine cocktail given.  CT head personally reviewed and interpreted which is negative for any acute abnormalities.  Does demonstrate some mild paranasal sinus mucosal thickening.  No dental abscess on exam.  Advised patient to continue taking antibiotics for dental infection.  Rdental resources given. Patient reassessed numerous times as noted above.  Patient admits to improvement after migraine cocktail and IV fluids.   Low suspicion for acute process.  No infectious symptoms to suggest infectious etiology.  Possible rebound headaches?? Referred pain from dental infection? Patient stable for discharge. Strict ED precautions discussed with patient. Patient states understanding and agrees to plan. Patient discharged home in no acute distress and stable vitals   Co morbidities that complicate the patient evaluation  HIV  Social Determinants of Health:  No PCP      Final diagnoses:  Migraine without status migrainosus, not intractable, unspecified migraine type    ED Discharge Orders          Ordered  Ambulatory referral to Neurology       Comments: An appointment is requested in approximately: 4 weeks   03/31/24 1320               Lorelle Aleck BROCKS, [place] 03/31/24 1354    Jerrol Agent, MD 03/31/24 [phone]

## 2024-03-31 NOTE — ED Triage Notes (Signed)
 C/o R sided facial pain and migraine. States concern pain is from dental problems.  Seen here yesterday for the same.  Originally asked for a worked note then decided to check in.
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
## 2024-04-10 ENCOUNTER — Ambulatory Visit: Admitting: Neurology

## 2024-04-10 ENCOUNTER — Encounter: Payer: Self-pay | Admitting: Neurology

## 2024-04-10 VITALS — BP 125/75 | HR 72 | Ht 74.0 in | Wt 180.8 lb

## 2024-04-10 DIAGNOSIS — G43709 Chronic migraine without aura, not intractable, without status migrainosus: Secondary | ICD-10-CM

## 2024-04-10 DIAGNOSIS — Z8249 Family history of ischemic heart disease and other diseases of the circulatory system: Secondary | ICD-10-CM

## 2024-04-10 MED ORDER — AMITRIPTYLINE HCL 10 MG PO TABS: 10.0000 mg | ORAL_TABLET | Freq: Every day | ORAL | 5 refills | Status: DC

## 2024-04-10 MED ORDER — SUMATRIPTAN SUCCINATE 50 MG PO TABS: 50.0000 mg | ORAL_TABLET | ORAL | 5 refills | Status: DC | PRN

## 2024-04-10 MED ORDER — ONDANSETRON HCL 4 MG PO TABS: 4.0000 mg | ORAL_TABLET | Freq: Three times a day (TID) | ORAL | 5 refills | Status: AC | PRN

## 2024-04-10 NOTE — Patient Instructions (Addendum)
  CHECK MRI AND MRA OF BRAIN. We have sent a referral to [facility] for your MRI and they will call you directly to schedule your appointment. They are located at [address] [facility]. If you need to contact them directly please call [phone].  Start AMITRIPTYLINE  10MG  AT BEDTIME.  Contact us  in 4 weeks with update and we can increase dose if needed. STOP EXCEDRIN Take SUMATRIPTAN  50MG  at earliest onset of headache.  May repeat dose once in 2 hours if needed.  Maximum 2 tablets in 24 hours. Take ONDANSETRON  for nausea  Limit use of pain relievers to no more than 9 days out of the month.  These medications include acetaminophen , NSAIDs (ibuprofen /Advil /Motrin , naproxen /Aleve , triptans (Imitrex /sumatriptan ), Excedrin, and narcotics.  This will help reduce risk of rebound headaches. Routine exercise Stay adequately hydrated (aim for 64 oz water  daily) Keep headache diary Maintain proper stress management Maintain proper sleep hygiene Do not skip meals Consider supplements:  magnesium citrate 400mg  daily, riboflavin 400mg  daily, coenzyme Q10 100mg  three times daily.

## 2024-04-14 ENCOUNTER — Telehealth: Payer: Self-pay

## 2024-04-14 NOTE — Telephone Encounter (Signed)
 Patient stop by the office today on his way to work. Per patient the sumatriptan  is not working. He had a migraine since he was seen last Friday. And the Daily medication is causing his feet to sweat.    Please advise

## 2024-04-15 MED ORDER — TOPIRAMATE 25 MG PO TABS: 25.0000 mg | ORAL_TABLET | Freq: Every day | ORAL | 5 refills | Status: AC

## 2024-04-15 MED ORDER — RIZATRIPTAN BENZOATE 10 MG PO TABS: 10.0000 mg | ORAL_TABLET | ORAL | 5 refills | Status: AC | PRN

## 2024-04-15 NOTE — Telephone Encounter (Signed)
 Patient advised of Dr.Jaffe note, Stop sumatriptan  and amitriptyline .    Please send in prescription for: Topiramate  25mg  at bedtime.  We can increase dose in 4 weeks if needed.  Side effects may include numbness and tingling so if she experiences this, try to give it a chance for her body to adjust. Instead of sumatriptan , send in prescription for rizatriptan  10mg .  Take as needed.  May repeat after 2 hours.  Maximum 2 tablets in 24 hours.   There is no quick fix to stop these headaches.  It may take time.  Also she cannot take the triptans or any over the counter pain reliever frequently or daily as that will just contribute to ongoing headaches.  Limit use of pain relievers to no more than 9 days out of the month to prevent risk of rebound or medication-overuse headache.   Patient agrees to the change in medication.

## 2024-04-28 ENCOUNTER — Encounter: Admitting: *Deleted

## 2024-04-28 ENCOUNTER — Other Ambulatory Visit: Payer: Self-pay

## 2024-04-28 DIAGNOSIS — Z006 Encounter for examination for normal comparison and control in clinical research program: Secondary | ICD-10-CM

## 2024-04-28 MED ORDER — STUDY - MK-8591A-053 - BICTEGRAVIR/EMTRICITABINE/TENOFOVIR ALAFENAMIDE (BIKTARVY) 50-200-25 MG OR PLACEBO TABLET (PI-VAN DAM): ORAL_TABLET | ORAL | 0 refills | Status: DC

## 2024-04-28 MED ORDER — STUDY - MK-8591A-053 - MK-8591A 100/0.25 MG OR PLACEBO TABLET (PI-VAN DAM): ORAL_TABLET | ORAL | 0 refills | Status: DC

## 2024-04-28 NOTE — Research (Signed)
 Clarence Simpson seen today for her week 120 visit for [id number] study, A Phase 3, Randomized, Active-Controlled, Double-Blind Clinical Study to Evaluate the Antiretroviral Activity, Safety, and Tolerability of Doravirine/Islatravir (DOR/ISL 100 mg/0.25 mg) Once-Daily in HIV-1 Infected Treatment-Nave Participants. All study procedures completed per protocol. Study IP dispensed. She will return in November for her next study visit.

## 2024-05-08 ENCOUNTER — Inpatient Hospital Stay: Admission: RE | Admit: 2024-05-08 | Source: Ambulatory Visit

## 2024-05-08 ENCOUNTER — Other Ambulatory Visit

## 2024-05-21 ENCOUNTER — Inpatient Hospital Stay: Admission: RE | Admit: 2024-05-21 | Source: Ambulatory Visit

## 2024-05-21 ENCOUNTER — Other Ambulatory Visit

## 2024-05-23 ENCOUNTER — Encounter (HOSPITAL_COMMUNITY): Payer: Self-pay

## 2024-05-23 ENCOUNTER — Encounter (HOSPITAL_BASED_OUTPATIENT_CLINIC_OR_DEPARTMENT_OTHER): Payer: Self-pay

## 2024-05-23 ENCOUNTER — Emergency Department (HOSPITAL_COMMUNITY)
Admission: EM | Admit: 2024-05-23 | Discharge: 2024-05-23 | Disposition: A | Discharge status: Home / Self Care | Attending: Emergency Medicine | Admitting: Emergency Medicine

## 2024-05-23 ENCOUNTER — Emergency Department (HOSPITAL_COMMUNITY)
Admission: EM | Admit: 2024-05-23 | Discharge: 2024-05-24 | Disposition: A | Discharge status: Home / Self Care | Source: Home / Self Care

## 2024-05-23 ENCOUNTER — Emergency Department (HOSPITAL_BASED_OUTPATIENT_CLINIC_OR_DEPARTMENT_OTHER)
Admission: EM | Admit: 2024-05-23 | Discharge: 2024-05-23 | Disposition: A | Discharge status: Home / Self Care | Attending: Emergency Medicine | Admitting: Emergency Medicine

## 2024-05-23 ENCOUNTER — Other Ambulatory Visit: Payer: Self-pay

## 2024-05-23 ENCOUNTER — Encounter (HOSPITAL_COMMUNITY): Payer: Self-pay | Admitting: Pharmacy Technician

## 2024-05-23 DIAGNOSIS — L299 Pruritus, unspecified: Secondary | ICD-10-CM | POA: Insufficient documentation

## 2024-05-23 DIAGNOSIS — Z7982 Long term (current) use of aspirin: Secondary | ICD-10-CM | POA: Insufficient documentation

## 2024-05-23 DIAGNOSIS — R21 Rash and other nonspecific skin eruption: Secondary | ICD-10-CM | POA: Diagnosis present

## 2024-05-23 DIAGNOSIS — Z21 Asymptomatic human immunodeficiency virus [HIV] infection status: Secondary | ICD-10-CM | POA: Insufficient documentation

## 2024-05-23 DIAGNOSIS — Z79899 Other long term (current) drug therapy: Secondary | ICD-10-CM | POA: Insufficient documentation

## 2024-05-23 MED ORDER — HYDROCORTISONE 1 % EX CREA
TOPICAL_CREAM | Freq: Two times a day (BID) | CUTANEOUS | Status: DC
  Filled 2024-05-23: qty 28

## 2024-05-23 MED ORDER — TRIAMCINOLONE ACETONIDE 0.1 % EX CREA: TOPICAL_CREAM | Freq: Two times a day (BID) | CUTANEOUS | Status: DC

## 2024-05-23 MED ORDER — METHYLPREDNISOLONE 4 MG PO TBPK: ORAL_TABLET | ORAL | 0 refills | Status: AC

## 2024-05-23 MED ORDER — PERMETHRIN 5 % EX CREA: TOPICAL_CREAM | CUTANEOUS | 1 refills | Status: DC

## 2024-05-23 NOTE — ED Provider Notes (Signed)
 [place] EMERGENCY DEPARTMENT AT [facility] Provider Note   CSN: [id number] Arrival date & time: 05/23/24  1401     Patient presents with: Rash   Clarence Simpson is a 38 y.o. adult with history of HIV, who presents with concern for discolored spots on the arms and legs that she noticed this morning.  She reports that the spots are itchy.  She reports she started wearing a new wig just prior to the these spots popping up.  Denies any new medications, soaps, lotions, foods.  No systemic symptoms such as fever, chills, vomiting.  No involvement of palms or soles.    Rash      Prior to Admission medications   Medication Sig Start Date End Date Taking? Authorizing Provider  albuterol  (VENTOLIN  HFA) 108 (90 Base) MCG/ACT inhaler Inhale 2 puffs into the lungs every 6 (six) hours as needed for wheezing or shortness of breath. 01/10/22   Fleeta Kathie Jomarie LOISE, MD  aspirin -acetaminophen -caffeine (EXCEDRIN MIGRAINE) 250-250-65 MG tablet Take 2 tablets by mouth every 6 (six) hours as needed for headache.    [provider]  chlorhexidine  (PERIDEX ) 0.12 % solution Use as directed 15 mLs in the mouth or throat 2 (two) times daily. 03/30/24   Barrett, Jamie N, PA-C  magic mouthwash (lidocaine , diphenhydrAMINE , alum & mag hydroxide) suspension Swish and spit 5 mLs 3 (three) times daily. 03/30/24   Barrett, Warren LOISE, PA-C  ondansetron  (ZOFRAN ) 4 MG tablet Take 1 tablet (4 mg total) by mouth every 8 (eight) hours as needed. 04/10/24   Skeet Juliene SAUNDERS, DO  rizatriptan  (MAXALT ) 10 MG tablet Take 1 tablet (10 mg total) by mouth as needed for migraine. May repeat in 2 hours if needed 04/15/24   Skeet Juliene SAUNDERS, DO  Study - FX-[id number] - bictegravir/emtrictabine/tenofovir  alafenamide (BIKTARVY ) 50-200-25 mg or placebo tablet ([facility]) For Investigational Drug Use Only. Take 1 tablet my mouth daily with water . Take at the same time every day. Please bring back all bottles to study coordinator at your  next visit. Please contact [facility] regarding any question about this medication. 04/28/24   Orlando Burnard SAUNDERS DEVONNA  Study - FX-1408J-946 - FX-1408J 100/0.25 mg or placebo tablet [facility]) For Investigational Drug Use Only. Take 1 tablet my mouth daily with water . Take at the same time every day. Please bring back all bottles to study coordinator at your next visit. Please contact [facility] regarding any question about this medication. 04/28/24   Orlando Burnard SAUNDERS, PA-C  topiramate  (TOPAMAX ) 25 MG tablet Take 1 tablet (25 mg total) by mouth daily. 04/15/24   Skeet Juliene SAUNDERS, DO    Allergies: Patient has no known allergies.    Review of Systems  Skin:  Positive for rash.    Updated Vital Signs BP (!) 145/96   Pulse 89   Temp 98.6 F (37 C) (Oral)   Resp 18   SpO2 100%   Physical Exam Vitals and nursing note reviewed.  Constitutional:      Appearance: Normal appearance.  HENT:     Head: Atraumatic.  Pulmonary:     Effort: Pulmonary effort is normal.  Skin:    Comments: Areas of increased pigment on the arms and legs bilaterally.  Approximately 1 cm in diameter macules.  No surrounding scale, erythema, or edema.  No overlying wounds.  No rash on palms or soles, torso, or the mouth.  Neurological:     General: No focal deficit present.  Mental Status: She is alert.  Psychiatric:        Mood and Affect: Mood normal.        Behavior: Behavior normal.          (all labs ordered are listed, but only abnormal results are displayed) Labs Reviewed - No data to display  EKG: None  Radiology: No results found.   Procedures   Medications Ordered in the ED  hydrocortisone  cream 1 % ( Topical Given 05/23/24 1533)                                    Medical Decision Making    Differential diagnosis includes but is not limited to contact dermatitis, SJS, DRESS, sun spots, syphilis, melasma  ED Course:  Upon initial evaluation, patient is very  well-appearing, no acute distress.  Stable vitals.  Has some areas of increased pigment on the arms and legs bilaterally.  Reports some mild itchiness to the spots.  I do not appreciate any overlying scale, these areas do not seem consistent with a Candida infection.  No surrounding erythema, edema, no wounds, no concern for infection.  No involvement of palms or soles, no diffuse rash, low concern for syphilis.  No systemic symptoms such as fevers, cough, low concern for a viral exanthem or DRESS.  No new medications, no peeling or blistering, no involvement of mucosal surfaces, no concern for SJS.  Rash does not seem urticarial, no other allergic type symptoms.  Unclear to exact cause of the rash, but rash does not seem consistent with any emergent pathology.  Will treat with a hydrocortisone  cream.  Stable and appropriate for discharge home at this time.  Medications Given: Hydrocortisone  cream  Impression: Rash  Disposition:  The patient was discharged home with instructions to apply hydrocortisone  cream to the rash on the arms and legs twice daily for the next week.  Do not apply cream to the face.  Follow-up with PCP if rash is not improving within the next week. Return precautions given.      This chart was dictated using voice recognition software, Dragon. Despite the best efforts of this provider to proofread and correct errors, errors may still occur which can change documentation meaning.       Final diagnoses:  Rash    ED Discharge Orders     None          Veta Palma, DEVONNA 05/23/24 1537    Yolande Lamar BROCKS, MD 05/27/24 [phone]

## 2024-05-23 NOTE — ED Triage Notes (Addendum)
 Patient put a wig on yesterday (first time wearing this wig) and said today he began having redness on both eye brows. Feels itchy. 3rd time being seen today for this.

## 2024-05-23 NOTE — ED Notes (Signed)
 Pt d/c instructions, medications, and follow-up care reviewed with pt. Pt verbalized understanding and had no further questions at time of d/c. Pt CA&Ox4, ambulatory, and in NAD at time of d/c

## 2024-05-23 NOTE — ED Notes (Signed)
 Pt requesting to be seen by provider again. Provider notified. Provider bedside to speak with patient.

## 2024-05-23 NOTE — ED Triage Notes (Signed)
 Pt. States has noticed a rash at face and right wrist that was peeling [sic]. Pt. Is in no distress and is breathing normally.

## 2024-05-23 NOTE — ED Triage Notes (Signed)
 Pt here with rash to face. Seen at [facility] for same prior to coming here.

## 2024-05-23 NOTE — ED Provider Notes (Signed)
 [place] EMERGENCY DEPARTMENT AT [facility] Provider Note   CSN: [id number] Arrival date & time: 05/23/24  1611     Patient presents with: Rash   Clarence Simpson is a 38 y.o. adult presented to ED with concern for rash that he has noted that began earlier today.  The patient reports that he has small, isolated, itching bumps that have appeared predominantly on the dorsum of his both of his hands, as well as on the lower leg.  He reports feeling a spot near his left eyebrow as well.  Denies any involvement of his eyes or mouth.  Denies any recent infection or fever type symptoms.  Of note, the patient does have a history of HIV, but this is well-controlled per my review of his infectious disease records  The patient was seen earlier today at one of our other freestanding ED's and prescribed hydrocortisone  cream.   HPI     Prior to Admission medications   Medication Sig Start Date End Date Taking? Authorizing Provider  albuterol  (VENTOLIN  HFA) 108 (90 Base) MCG/ACT inhaler Inhale 2 puffs into the lungs every 6 (six) hours as needed for wheezing or shortness of breath. 01/10/22   Fleeta Kathie Jomarie LOISE, MD  aspirin -acetaminophen -caffeine (EXCEDRIN MIGRAINE) 250-250-65 MG tablet Take 2 tablets by mouth every 6 (six) hours as needed for headache.    [provider]  chlorhexidine  (PERIDEX ) 0.12 % solution Use as directed 15 mLs in the mouth or throat 2 (two) times daily. 03/30/24   Barrett, Jamie N, PA-C  magic mouthwash (lidocaine , diphenhydrAMINE , alum & mag hydroxide) suspension Swish and spit 5 mLs 3 (three) times daily. 03/30/24   Barrett, Warren LOISE, PA-C  methylPREDNISolone  (MEDROL  DOSEPAK) 4 MG TBPK tablet Use as directed 05/23/24  Yes Francia Verry, Donnice PARAS, MD  ondansetron  (ZOFRAN ) 4 MG tablet Take 1 tablet (4 mg total) by mouth every 8 (eight) hours as needed. 04/10/24   Skeet Juliene SAUNDERS, DO  rizatriptan  (MAXALT ) 10 MG tablet Take 1 tablet (10 mg total) by mouth as needed for  migraine. May repeat in 2 hours if needed 04/15/24   Skeet Juliene SAUNDERS, DO  Study - [id number] - bictegravir/emtrictabine/tenofovir  alafenamide (BIKTARVY ) 50-200-25 mg or placebo tablet ([facility]) For Investigational Drug Use Only. Take 1 tablet my mouth daily with water . Take at the same time every day. Please bring back all bottles to study coordinator at your next visit. Please contact [facility] regarding any question about this medication. 04/28/24   Orlando Burnard SAUNDERS DEVONNA  Study - FX-1408J-946 - FX-1408J 100/0.25 mg or placebo tablet [facility]) For Investigational Drug Use Only. Take 1 tablet my mouth daily with water . Take at the same time every day. Please bring back all bottles to study coordinator at your next visit. Please contact [facility] regarding any question about this medication. 04/28/24   Orlando Burnard SAUNDERS, PA-C  topiramate  (TOPAMAX ) 25 MG tablet Take 1 tablet (25 mg total) by mouth daily. 04/15/24   Skeet Juliene SAUNDERS, DO    Allergies: Patient has no known allergies.    Review of Systems  Updated Vital Signs BP (!) 153/88 (BP Location: Right Arm)   Pulse 99   Temp 98.6 F (37 C)   Resp 18   SpO2 96%   Physical Exam Constitutional:      General: She is not in acute distress. HENT:     Head: Normocephalic and atraumatic.  Eyes:     Conjunctiva/sclera: Conjunctivae normal.  Pupils: Pupils are equal, round, and reactive to light.  Cardiovascular:     Rate and Rhythm: Normal rate and regular rhythm.  Pulmonary:     Effort: Pulmonary effort is normal. No respiratory distress.  Abdominal:     General: There is no distension.     Tenderness: There is no abdominal tenderness.  Skin:    General: Skin is warm and dry.     Comments: Isolated single spots of discoloration on the dorsum of both hands, approximately 4 in total, as well as on the lower anterior legs; small lesion on left eyebrow; no involvement of the oral mucosa, no suspicious lesions on the palms   Neurological:     General: No focal deficit present.     Mental Status: She is alert. Mental status is at baseline.  Psychiatric:        Mood and Affect: Mood normal.        Behavior: Behavior normal.     (all labs ordered are listed, but only abnormal results are displayed) Labs Reviewed - No data to display  EKG: None  Radiology: No results found.   Procedures   Medications Ordered in the ED - No data to display                                  Medical Decision Making Risk Prescription drug management.   Patient's presentation may be an allergic reaction of hives given that this is itchy.  These do not appear clustered or consistent with scabies infection.  He did not have any type of viral prodrome or typical progression for monkeypox.  This does not appear consistent with shingles.  He is not on any type of new medications to raise concern for allergic reaction, Stevens-Johnson's syndrome.  There is no disclamation of the skin.  Doubt syphilis, no lesions of the palmar surface.  The patient can continue with the hydrocortisone  cream prescribed but I will put him on a Medrol  Dosepak as well given that there is more extensive involvement of the extremities.  I advised that it may take 7 to 10 days for this type of rash to run its course.  The patient can follow-up as an outpatient with his PCP or specialist     Final diagnoses:  Rash    ED Discharge Orders          Ordered    methylPREDNISolone  (MEDROL  DOSEPAK) 4 MG TBPK tablet        05/23/24 1703               Cottie Donnice PARAS, MD 05/23/24 [phone]

## 2024-05-23 NOTE — ED Notes (Signed)
 Pt requesting to speak with provider prior to discharge, Dr Cottie made aware

## 2024-05-23 NOTE — ED Notes (Signed)
 The pt asked to speak with the physician. ED doc made aware.   ED physician is at the bedside talking to the pt at this time.

## 2024-05-23 NOTE — Discharge Instructions (Addendum)
 You have been seen here for your rash.  You have been prescribed hydrocortisone  cream to apply to the rash on your arms and legs.  Apply this 2 times daily for the next 7 days.  Do not apply this to your face.  If you do not notice improvement in the rash, please follow-up with your PCP for reevaluation.  Please return to the ER for any severe skin peeling, rash in your mouth or on the hands or feet, fevers, any other new or concerning symptoms

## 2024-05-23 NOTE — ED Provider Notes (Signed)
 After discharge, the patient requested to speak to me again.  This time the patient reports that when they went to use the bathroom they noted that there were small bugs near my privates.  The patient was examined in a private room and I could not visualize any insects.  However, I thought it was reasonable to add on permethrin treatment and sent this to the patient's pharmacy   Cottie Donnice PARAS, MD 05/23/24 [phone]

## 2024-05-24 MED ORDER — PERMETHRIN 5 % EX CREA: TOPICAL_CREAM | CUTANEOUS | 0 refills | Status: AC

## 2024-05-24 NOTE — Discharge Instructions (Signed)
 Pick up the permethrin-- use as directed. Follow-up with your doctor. Return here for new concerns.

## 2024-05-24 NOTE — ED Provider Notes (Signed)
 [place] EMERGENCY DEPARTMENT AT [facility] Provider Note   CSN: [id number] Arrival date & time: 05/23/24  1857     Patient presents with: Rash   Clarence Simpson is a 38 y.o. adult.   The history is provided by the patient and medical records.  Rash  38 year old male presenting to the ED with bodily rash and concern of itching.  This is his third visit in 3 days for similar.  States he used a new wig and thinks it may have brought him out in a rash.  States his whole body has been itching and he feels like there is some swelling of his left eyebrow.  Also thinks there are tiny insects all over him.  Given prescriptions for steroids and permethrin cream yesterday but has not yet filled this or attempted any other medications.  Prior to Admission medications   Medication Sig Start Date End Date Taking? Authorizing Provider  permethrin (ELIMITE) 5 % cream Apply to affected area once 05/24/24  Yes Jarold, Olam HERO, PA-C  albuterol  (VENTOLIN  HFA) 108 (90 Base) MCG/ACT inhaler Inhale 2 puffs into the lungs every 6 (six) hours as needed for wheezing or shortness of breath. 01/10/22   Fleeta Kathie Jomarie LOISE, MD  aspirin -acetaminophen -caffeine (EXCEDRIN MIGRAINE) 250-250-65 MG tablet Take 2 tablets by mouth every 6 (six) hours as needed for headache.    [provider]  chlorhexidine  (PERIDEX ) 0.12 % solution Use as directed 15 mLs in the mouth or throat 2 (two) times daily. 03/30/24   Barrett, Jamie N, PA-C  magic mouthwash (lidocaine , diphenhydrAMINE , alum & mag hydroxide) suspension Swish and spit 5 mLs 3 (three) times daily. 03/30/24   Barrett, Warren LOISE, PA-C  methylPREDNISolone  (MEDROL  DOSEPAK) 4 MG TBPK tablet Use as directed 05/23/24   Cottie Donnice PARAS, MD  ondansetron  (ZOFRAN ) 4 MG tablet Take 1 tablet (4 mg total) by mouth every 8 (eight) hours as needed. 04/10/24   Skeet Juliene SAUNDERS, DO  rizatriptan  (MAXALT ) 10 MG tablet Take 1 tablet (10 mg total) by mouth as needed for migraine.  May repeat in 2 hours if needed 04/15/24   Skeet Juliene SAUNDERS, DO  Study - [id number] - bictegravir/emtrictabine/tenofovir  alafenamide (BIKTARVY ) 50-200-25 mg or placebo tablet ([facility]) For Investigational Drug Use Only. Take 1 tablet my mouth daily with water . Take at the same time every day. Please bring back all bottles to study coordinator at your next visit. Please contact [facility] regarding any question about this medication. 04/28/24   Orlando Burnard SAUNDERS DEVONNA  Study - FX-1408J-946 - FX-1408J 100/0.25 mg or placebo tablet [facility]) For Investigational Drug Use Only. Take 1 tablet my mouth daily with water . Take at the same time every day. Please bring back all bottles to study coordinator at your next visit. Please contact [facility] regarding any question about this medication. 04/28/24   Orlando Burnard SAUNDERS, PA-C  topiramate  (TOPAMAX ) 25 MG tablet Take 1 tablet (25 mg total) by mouth daily. 04/15/24   Skeet Juliene SAUNDERS, DO    Allergies: Patient has no known allergies.    Review of Systems  Skin:  Positive for rash.  All other systems reviewed and are negative.   Updated Vital Signs BP 138/88   Pulse 97   Temp 99.1 F (37.3 C) (Oral)   Resp 18   Ht 6' 2 (1.88 m)   Wt 75 kg   SpO2 100%   BMI 21.23 kg/m   Physical Exam Vitals and nursing note  reviewed.  Constitutional:      Appearance: She is well-developed.  HENT:     Head: Normocephalic and atraumatic.  Eyes:     Conjunctiva/sclera: Conjunctivae normal.     Pupils: Pupils are equal, round, and reactive to light.     Comments: Minute are of minor redness along left medial eyebrow, no rash or significant swelling associated with this  Cardiovascular:     Rate and Rhythm: Normal rate and regular rhythm.     Heart sounds: Normal heart sounds.  Pulmonary:     Effort: Pulmonary effort is normal.     Breath sounds: Normal breath sounds.  Abdominal:     General: Bowel sounds are normal.     Palpations: Abdomen is soft.   Musculoskeletal:        General: Normal range of motion.     Cervical back: Normal range of motion.  Skin:    General: Skin is warm and dry.     Comments: No discrete bodily rash, reports seeing tiny white insects, however none visualized on my exam  Neurological:     Mental Status: She is alert and oriented to person, place, and time.     (all labs ordered are listed, but only abnormal results are displayed) Labs Reviewed - No data to display  EKG: None  Radiology: No results found.   Procedures   Medications Ordered in the ED - No data to display                                  Medical Decision Making Risk Prescription drug management.   38 year old male here with concern of rash and generalized bodily itching.  Third visit in 3 days for same.  Has not attempted to fill any of the prescriptions given to him recently.  He has a minor area of redness along the medial left eyebrow and reports bugs all over his body, none are visualized on my exam.  I do not see any evidence of acute allergic reaction or anaphylaxis at this time.  There is no bodily rash.  Requested new prescription for permethrin to be sent to alternative pharmacy which has been done.  Informed to use as directed.  Can follow-up with PCP.  Return here for new concerns.  Final diagnoses:  Itching    ED Discharge Orders          Ordered    permethrin (ELIMITE) 5 % cream        05/24/24 0103               Jarold Olam HERO, PA-C 05/24/24 0106    Griselda Norris, MD 05/24/24 [phone]

## 2024-07-20 ENCOUNTER — Other Ambulatory Visit: Payer: Self-pay

## 2024-07-20 ENCOUNTER — Encounter: Admitting: *Deleted

## 2024-07-20 DIAGNOSIS — Z006 Encounter for examination for normal comparison and control in clinical research program: Secondary | ICD-10-CM

## 2024-07-20 MED ORDER — STUDY - MK-8591A-053 - BICTEGRAVIR/EMTRICITABINE/TENOFOVIR ALAFENAMIDE (BIKTARVY) 50-200-25 MG OR PLACEBO TABLET (PI-VAN DAM): ORAL_TABLET | ORAL | 0 refills | Status: AC

## 2024-07-20 MED ORDER — STUDY - MK-8591A-053 - MK-8591A 100/0.25 MG OR PLACEBO TABLET (PI-VAN DAM): ORAL_TABLET | ORAL | 0 refills | Status: AC

## 2024-07-20 NOTE — Research (Unsigned)
 Clarence Simpson seen today for her week 132 visit for FX1408J-946 study, A Phase 3, Randomized, Active-Controlled, Double-Blind Clinical Study to Evaluate the Antiretroviral Activity, Safety, and Tolerability of Doravirine/Islatravir (DOR/ISL 100 mg/0.25 mg) Once-Daily in HIV-1 Infected Treatment-Nave Participants. All study procedures completed per protocol. Study IP dispensed. She will return in February for her next study visit.

## 2024-08-04 ENCOUNTER — Other Ambulatory Visit: Payer: Self-pay

## 2024-08-04 ENCOUNTER — Encounter: Payer: Self-pay | Admitting: Infectious Disease

## 2024-08-04 ENCOUNTER — Ambulatory Visit: Admitting: Infectious Disease

## 2024-08-04 ENCOUNTER — Other Ambulatory Visit (HOSPITAL_COMMUNITY)
Admission: RE | Admit: 2024-08-04 | Discharge: 2024-08-04 | Disposition: A | Discharge status: Home / Self Care | Source: Ambulatory Visit | Attending: Infectious Disease | Admitting: Infectious Disease

## 2024-08-04 VITALS — BP 125/81 | HR 79 | Wt 183.0 lb

## 2024-08-04 DIAGNOSIS — R85612 Low grade squamous intraepithelial lesion on cytologic smear of anus (LGSIL): Secondary | ICD-10-CM

## 2024-08-04 DIAGNOSIS — Z7185 Encounter for immunization safety counseling: Secondary | ICD-10-CM | POA: Diagnosis not present

## 2024-08-04 DIAGNOSIS — B20 Human immunodeficiency virus [HIV] disease: Secondary | ICD-10-CM | POA: Diagnosis not present

## 2024-08-04 DIAGNOSIS — Z113 Encounter for screening for infections with a predominantly sexual mode of transmission: Secondary | ICD-10-CM | POA: Diagnosis not present

## 2024-08-04 DIAGNOSIS — Z789 Other specified health status: Secondary | ICD-10-CM | POA: Diagnosis not present

## 2024-08-04 MED ORDER — SPIRONOLACTONE 100 MG PO TABS: 100.0000 mg | ORAL_TABLET | Freq: Every day | ORAL | 3 refills | Status: AC

## 2024-08-04 MED ORDER — ESTRADIOL 2 MG PO TABS: 4.0000 mg | ORAL_TABLET | Freq: Every day | ORAL | 3 refills | Status: AC

## 2024-08-04 NOTE — Progress Notes (Signed)
 Subjective:  Chief complaint: follow-up for HIV disease on medications   Patient ID: Clarence Simpson, adult    DOB: [birth year], 38 y.o.   MRN: [id number]  HPI  ,Discussed the use of AI scribe software for clinical note transcription with the patient, who gave verbal consent to proceed.  History of Present Illness   Clarence Simpson is a 38 year old male who presents for follow-up and discussion of transgender hormone therapy.  She is participating in a clinical trial involving either a [employer] drug or Biktarvy , though the specific medication is blinded. Recent lab results from late November show an undetectable viral load and a CD4 count of 628, which is considered normal. She has not been seen for over a year, with the last visit in November 2024.  She had an abnormal anal Pap smear in the past and underwent a high-resolution anoscopy in March. She did not receive follow-up instructions post-procedure and has not been contacted since she says.She wants another anal Pap smear due to feeling 'little things' and 'bubbles' in the area.  She is interested in starting transgender hormone therapy, specifically requesting spironolactone  and estradiol . She has not previously been on these medications and is willing to start at a dose of 100 mg of spironolactone .  She has not received many vaccines and prefers to avoid them, despite understanding their benefits.  She is also interested in testing for sexually transmitted infections, including gonorrhea, chlamydia, and syphilis.      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/30/2023   Vaccine counseling 12/13/2022    Past Surgical  History:  Procedure Laterality Date   HIGH RESOLUTION ANOSCOPY N/A 11/08/2023   Procedure: HIGH RESOLUTION ANOSCOPY;  Surgeon: Debby Hila, MD;  Location: [facility];  Service: General;  Laterality: N/A;   LEFT HEART CATHETERIZATION WITH CORONARY ANGIOGRAM N/A 09/02/2014   Procedure: LEFT HEART CATHETERIZATION WITH CORONARY ANGIOGRAM;  Surgeon: Lonni JONETTA Cash, MD;  Location: [facility] CATH LAB;  Service: Cardiovascular;  Laterality: N/A;   RECTAL BIOPSY N/A 11/08/2023   Procedure: BIOPSY/ABLATION;  Surgeon: Debby Hila, MD;  Location: [facility];  Service: General;  Laterality: N/A;   WART FULGURATION N/A 10/30/2012   Procedure: Fulgeration  Anal condylomata, Rectal exam under anesthesia;  Surgeon: Krystal CHRISTELLA Spinner, MD;  Location: [facility];  Service: General;  Laterality: N/A;    Family History  Problem Relation Age of Onset   Heart failure Father       Social History   Socioeconomic History   Marital status: Single    Spouse name: Not on file   Number of children: Not on file   Years of education: Not on file   Highest education level: Not on file  Occupational History   Not on file  Tobacco Use   Smoking status: Every Day    Types: Cigars   Smokeless tobacco: Never   Tobacco comments:    smokes Black and Milds - 1/day  Vaping Use   Vaping status: Never Used  Substance and Sexual Activity   Alcohol use: Yes    Alcohol/week: 1.0 standard drink of alcohol  Types: 1 Standard drinks or equivalent per week    Comment: occasionally   Drug use: No   Sexual activity: Not on file    Comment: given condoms  Other Topics Concern   Not on file  Social History Narrative   Not on file   Social Drivers of Health   Financial Resource Strain: Not on file  Food Insecurity: Not on file  Transportation Needs: Not on file  Physical Activity: Not on file  Stress: Not on file  Social Connections: Not on file    No Known  Allergies   Current Outpatient Medications:    albuterol  (VENTOLIN  HFA) 108 (90 Base) MCG/ACT inhaler, Inhale 2 puffs into the lungs every 6 (six) hours as needed for wheezing or shortness of breath., Disp: 8 g, Rfl: 4   aspirin -acetaminophen -caffeine (EXCEDRIN MIGRAINE) 250-250-65 MG tablet, Take 2 tablets by mouth every 6 (six) hours as needed for headache., Disp: , Rfl:    chlorhexidine  (PERIDEX ) 0.12 % solution, Use as directed 15 mLs in the mouth or throat 2 (two) times daily., Disp: 120 mL, Rfl: 0   magic mouthwash (lidocaine , diphenhydrAMINE , alum & mag hydroxide) suspension, Swish and spit 5 mLs 3 (three) times daily., Disp: 360 mL, Rfl: 0   methylPREDNISolone  (MEDROL  DOSEPAK) 4 MG TBPK tablet, Use as directed, Disp: 21 tablet, Rfl: 0   ondansetron  (ZOFRAN ) 4 MG tablet, Take 1 tablet (4 mg total) by mouth every 8 (eight) hours as needed., Disp: 20 tablet, Rfl: 5   permethrin  (ELIMITE ) 5 % cream, Apply to affected area once, Disp: 60 g, Rfl: 0   rizatriptan  (MAXALT ) 10 MG tablet, Take 1 tablet (10 mg total) by mouth as needed for migraine. May repeat in 2 hours if needed, Disp: 10 tablet, Rfl: 5   Study - [id number] - bictegravir/emtrictabine/tenofovir  alafenamide (BIKTARVY ) 50-200-25 mg or placebo tablet ([place]), For Investigational Drug Use Only. Take 1 tablet my mouth daily with water . Take at the same time every day. Please bring back all bottles to study coordinator at your next visit. Please contact [facility] regarding any question about this medication., Disp: 105 tablet, Rfl: 0   Study - [id number] - [id number] 100/0.25 mg or placebo tablet ([facility]), For Investigational Drug Use Only. Take 1 tablet my mouth daily with water . Take at the same time every day. Please bring back all bottles to study coordinator at your next visit. Please contact [facility] regarding any question about this medication., Disp: 105 tablet, Rfl: 0   topiramate  (TOPAMAX ) 25 MG tablet, Take 1  tablet (25 mg total) by mouth daily., Disp: 30 tablet, Rfl: 5   Review of Systems  Constitutional:  Negative for activity change, appetite change, chills, diaphoresis, fatigue, fever and unexpected weight change.  HENT:  Negative for congestion, rhinorrhea, sinus pressure, sneezing, sore throat and trouble swallowing.   Eyes:  Negative for photophobia and visual disturbance.  Respiratory:  Negative for cough, chest tightness, shortness of breath, wheezing and stridor.   Cardiovascular:  Negative for chest pain, palpitations and leg swelling.  Gastrointestinal:  Negative for abdominal distention, abdominal pain, anal bleeding, blood in stool, constipation, diarrhea, nausea and vomiting.  Genitourinary:  Negative for difficulty urinating, dysuria, flank pain and hematuria.  Musculoskeletal:  Negative for arthralgias, back pain, gait problem, joint swelling and myalgias.  Skin:  Negative for color change, pallor, rash and wound.  Neurological:  Negative for dizziness, tremors, weakness and light-headedness.  Hematological:  Negative for adenopathy. Does not bruise/bleed easily.  Psychiatric/Behavioral:  Negative for agitation, behavioral problems, confusion, decreased concentration, dysphoric mood and sleep disturbance.        Objective:   Physical Exam Constitutional:      Appearance: She is well-developed.  HENT:     Head: Normocephalic and atraumatic.  Eyes:     Conjunctiva/sclera: Conjunctivae normal.  Cardiovascular:     Rate and Rhythm: Normal rate and regular rhythm.  Pulmonary:     Effort: Pulmonary effort is normal. No respiratory distress.     Breath sounds: No wheezing.  Abdominal:     General: There is no distension.     Palpations: Abdomen is soft.  Musculoskeletal:        General: No tenderness. Normal range of motion.     Cervical back: Normal range of motion and neck supple.  Skin:    General: Skin is warm and dry.     Coloration: Skin is not pale.     Findings:  No erythema or rash.  Neurological:     General: No focal deficit present.     Mental Status: She is alert and oriented to person, place, and time.  Psychiatric:        Mood and Affect: Mood normal.        Behavior: Behavior normal.        Thought Content: Thought content normal.        Judgment: Judgment normal.           Assessment & Plan:   Assessment and Plan    HIV disease Well-controlled with undetectable viral load and CD4 count of 628. 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 - Reviewed notes from Dr. Debby regarding previous anoscopy --I do not see a follow-up visit. Lorenza said she had one but I see none documented with instructions so I will just repeat the anal pap and if abnnormal refer back to Dr Debby who she should probably see if she wants thorough evaluation of lesions she perceives.   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 Explained potential side effects and need for baseline labs. - Started spironolactone  100 mg daily. - Ordered baseline metabolic panel to check electrolytes. - Scheduled follow-up lab appointment in two weeks to monitor electrolytes. - Scheduled follow-up appointment in two months to assess hormone therapy progress.  Screening for sexually transmitted infections Requested screening for gonorrhea, chlamydia, and syphilis. - Ordered STI screening for gonorrhea, chlamydia, and syphilis.  Vaccine counseling Discussed importance of vaccinations including pneumonia, flu, and COVID vaccines. - She continues to refuse all vaccines

## 2024-08-04 NOTE — Addendum Note (Signed)
 Addended by: DALILA ANNABELLA SQUIBB on: 08/04/2024 04:53 PM   Modules accepted: Orders

## 2024-08-04 NOTE — Addendum Note (Signed)
 Addended by: Kawthar Ennen M on: 08/04/2024 11:32 AM   Modules accepted: Orders

## 2024-08-05 LAB — CYTOLOGY, (ORAL, ANAL, URETHRAL) ANCILLARY ONLY
Chlamydia: NEGATIVE
Chlamydia: NEGATIVE
Comment: NEGATIVE
Comment: NEGATIVE
Comment: NORMAL
Comment: NORMAL
Neisseria Gonorrhea: NEGATIVE
Neisseria Gonorrhea: NEGATIVE

## 2024-08-05 LAB — URINE CYTOLOGY ANCILLARY ONLY
Chlamydia: NEGATIVE
Comment: NEGATIVE
Comment: NORMAL
Neisseria Gonorrhea: NEGATIVE

## 2024-08-07 LAB — CYTOLOGY - PAP
Comment: NEGATIVE
Diagnosis: UNDETERMINED — AB
High risk HPV: NEGATIVE

## 2024-08-07 LAB — BASIC METABOLIC PANEL WITHOUT GFR
BUN: 13 mg/dL (ref 7–25)
CO2: 30 mmol/L (ref 20–32)
Calcium: 9 mg/dL (ref 8.6–10.3)
Chloride: 108 mmol/L (ref 98–110)
Creat: 0.99 mg/dL (ref 0.60–1.26)
Glucose, Bld: 86 mg/dL (ref 65–99)
Potassium: 4.2 mmol/L (ref 3.5–5.3)
Sodium: 141 mmol/L (ref 135–146)

## 2024-08-07 LAB — T PALLIDUM AB: T Pallidum Abs: POSITIVE — AB

## 2024-08-07 LAB — RPR TITER: RPR Titer: 1:2 {titer} — ABNORMAL HIGH

## 2024-08-07 LAB — SYPHILIS: RPR W/REFLEX TO RPR TITER AND TREPONEMAL ANTIBODIES, TRADITIONAL SCREENING AND DIAGNOSIS ALGORITHM: RPR Ser Ql: REACTIVE — AB

## 2024-08-10 ENCOUNTER — Other Ambulatory Visit: Payer: Self-pay | Admitting: Infectious Disease

## 2024-08-10 ENCOUNTER — Ambulatory Visit: Payer: Self-pay

## 2024-08-10 DIAGNOSIS — R85610 Atypical squamous cells of undetermined significance on cytologic smear of anus (ASC-US): Secondary | ICD-10-CM

## 2024-08-17 ENCOUNTER — Other Ambulatory Visit: Payer: Self-pay

## 2024-10-06 ENCOUNTER — Ambulatory Visit: Admitting: Infectious Disease

## 2024-10-13 ENCOUNTER — Encounter

## 2024-10-16 ENCOUNTER — Ambulatory Visit: Admitting: Neurology
